# Patient Record
Sex: Male | Born: 1995 | Race: White | Hispanic: No | Marital: Single | Smoking: Never smoker
Health system: Southern US, Community
[De-identification: ages and names within clinical notes are randomized; demographics above are authoritative.]

## PROBLEM LIST (undated history)

## (undated) DIAGNOSIS — R42 Dizziness and giddiness: Secondary | ICD-10-CM

## (undated) DIAGNOSIS — R002 Palpitations: Secondary | ICD-10-CM

## (undated) HISTORY — DX: Dizziness and giddiness: R42

## (undated) HISTORY — DX: Palpitations: R00.2

## (undated) HISTORY — PX: HERNIA REPAIR: SHX51

---
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
## 2017-07-08 ENCOUNTER — Emergency Department: Payer: 59

## 2017-07-08 ENCOUNTER — Observation Stay
Admission: EM | Admit: 2017-07-08 | Discharge: 2017-07-09 | Disposition: A | Discharge status: Home / Self Care | Payer: 59 | Attending: Internal Medicine | Admitting: Internal Medicine

## 2017-07-08 ENCOUNTER — Other Ambulatory Visit: Payer: Self-pay

## 2017-07-08 ENCOUNTER — Encounter: Payer: Self-pay | Admitting: *Deleted

## 2017-07-08 DIAGNOSIS — E876 Hypokalemia: Secondary | ICD-10-CM | POA: Diagnosis not present

## 2017-07-08 DIAGNOSIS — Z9889 Other specified postprocedural states: Secondary | ICD-10-CM | POA: Diagnosis not present

## 2017-07-08 DIAGNOSIS — R002 Palpitations: Principal | ICD-10-CM | POA: Insufficient documentation

## 2017-07-08 DIAGNOSIS — R000 Tachycardia, unspecified: Secondary | ICD-10-CM | POA: Diagnosis not present

## 2017-07-08 DIAGNOSIS — Z8249 Family history of ischemic heart disease and other diseases of the circulatory system: Secondary | ICD-10-CM | POA: Diagnosis not present

## 2017-07-08 LAB — TROPONIN I

## 2017-07-08 LAB — MAGNESIUM: Magnesium: 2.1 mg/dL (ref 1.7–2.4)

## 2017-07-08 LAB — BASIC METABOLIC PANEL
ANION GAP: 11 (ref 5–15)
BUN: 19 mg/dL (ref 6–20)
CALCIUM: 9.4 mg/dL (ref 8.9–10.3)
CHLORIDE: 103 mmol/L (ref 98–111)
CO2: 23 mmol/L (ref 22–32)
CREATININE: 1.05 mg/dL (ref 0.61–1.24)
GFR calc Af Amer: >60 mL/min (ref >60)
GFR calc non Af Amer: >60 mL/min (ref >60)
Glucose, Bld: 123 mg/dL — ABNORMAL HIGH (ref 70–99)
Potassium: 3.1 mmol/L — ABNORMAL LOW (ref 3.5–5.1)
Sodium: 137 mmol/L (ref 135–145)

## 2017-07-08 LAB — CBC
HCT: 46.6 % (ref 40.0–52.0)
HEMOGLOBIN: 16.1 g/dL (ref 13.0–18.0)
MCH: 30 pg (ref 26.0–34.0)
MCHC: 34.6 g/dL (ref 32.0–36.0)
MCV: 86.7 fL (ref 80.0–100.0)
Platelets: 263 10*3/uL (ref 150–440)
RBC: 5.37 MIL/uL (ref 4.40–5.90)
RDW: 13 % (ref 11.5–14.5)
WBC: 6.7 10*3/uL (ref 3.8–10.6)

## 2017-07-08 MED ORDER — LORAZEPAM 2 MG/ML IJ SOLN
1.0000 mg | Freq: Once | INTRAMUSCULAR | Status: AC
  Administered 2017-07-08: 1 mg via INTRAVENOUS
  Filled 2017-07-08: qty 1

## 2017-07-08 MED ORDER — MAGNESIUM SULFATE IN D5W 1-5 GM/100ML-% IV SOLN
1.0000 g | Freq: Once | INTRAVENOUS | Status: AC
  Administered 2017-07-08: 1 g via INTRAVENOUS
  Filled 2017-07-08: qty 100

## 2017-07-08 MED ORDER — POTASSIUM CHLORIDE CRYS ER 20 MEQ PO TBCR
40.0000 meq | EXTENDED_RELEASE_TABLET | Freq: Once | ORAL | Status: AC
  Administered 2017-07-08: 40 meq via ORAL
  Filled 2017-07-08: qty 2

## 2017-07-08 MED ORDER — POTASSIUM CHLORIDE 10 MEQ/100ML IV SOLN
10.0000 meq | Freq: Once | INTRAVENOUS | Status: AC
  Administered 2017-07-08: 10 meq via INTRAVENOUS
  Filled 2017-07-08: qty 100

## 2017-07-08 MED ORDER — SODIUM CHLORIDE 0.9 % IV BOLUS
500.0000 mL | Freq: Once | INTRAVENOUS | Status: AC
  Administered 2017-07-08: 500 mL via INTRAVENOUS

## 2017-07-08 NOTE — ED Triage Notes (Signed)
Pt at baseball game, announcing, in a hot environment. Pt states he started having palpitations approximately 45 minutes ago. Pt denies chest pain and syncope. Pt denies n/v. Pt denies shortness of breath. Pt has had a cough x 5 months, hx of pneumonia. Pt states productive cough. Pt seen on scene by paramedic, report to EMS that pt had heart rate greater than 200 bpm. Pt A&O x 4 and ambulatory at this time.

## 2017-07-08 NOTE — ED Notes (Signed)
ED Provider at bedside. 

## 2017-07-08 NOTE — H&P (Signed)
[facility] - [place] at [facility]   PATIENT NAME: Edward Bryan    MR#:  [id number]  DATE OF BIRTH:  03/02/1995  DATE OF ADMISSION:  07/08/2017  PRIMARY CARE PHYSICIAN: Patient, No Pcp Per   REQUESTING/REFERRING PHYSICIAN:   CHIEF COMPLAINT:   Chief Complaint  Patient presents with  . Palpitations    HISTORY OF PRESENT ILLNESS: Edward Bryan  is a 22 y.o. male without any medical history. Patient is currently visiting from [place].  He was announcing in a baseball game, when he suddenly felt his heart was racing and he also felt tingling in his fingers and cramping in his hands.  No chest pain, no syncope, no fever/chills, no N/V/D.  Patient denies using any caffeine, tobacco alcohol or, recreational drugs, but he admits being under a lot of stress lately.  His mother has hypertrophic cardiomyopathy with previous cardiac arrest. His heart rate was confirmed to be high by paramedics, around 200.  He was brought to emergency room for further evaluation and treatment. Blood test done emergency room are remarkable for low potassium at 3.1.  Troponin level is lower than 0.03 Chest x-ray is unremarkable.  On reviewing the telemetry strip, an episode of sinus tachycardia with heart rate at 160 was noted, but otherwise, patient has been in normal sinus rhythm with heart rate of 90-100. Patient is admitted as observation to be further monitored and evaluated with an echocardiogram.  PAST MEDICAL HISTORY:  History reviewed. No pertinent past medical history.  PAST SURGICAL HISTORY:  Past Surgical History:  Procedure Laterality Date  . HERNIA REPAIR      SOCIAL HISTORY:  Social History   Tobacco Use  . Smoking status: Never Smoker  . Smokeless tobacco: Never Used  Substance Use Topics  . Alcohol use: Yes    Alcohol/week: 1.2 oz    Types: 2 Cans of beer per week    Comment: last ETOH 07/07/17    FAMILY HISTORY: Positive history of hypertrophic  cardiomyopathy with previous cardiac arrest in mother.  DRUG ALLERGIES: No Known Allergies  REVIEW OF SYSTEMS:   CONSTITUTIONAL: No fever, fatigue or weakness.  EYES: No blurred or double vision.  EARS, NOSE, AND THROAT: No tinnitus or ear pain.  RESPIRATORY: No cough, shortness of breath, wheezing or hemoptysis.  CARDIOVASCULAR: Positive for lightheadedness and tachycardia.  No chest pain, orthopnea, edema.  GASTROINTESTINAL: No nausea, vomiting, diarrhea or abdominal pain.  GENITOURINARY: No dysuria, hematuria.  ENDOCRINE: No polyuria, nocturia,  HEMATOLOGY: No anemia, easy bruising or bleeding SKIN: No rash or lesion. MUSCULOSKELETAL: No joint pain or arthritis.   NEUROLOGIC: Patient reports a tingling sensation and cramping in the muscles of both hands, resolved now.  Negative for focal weakness. PSYCHIATRY: No anxiety or depression.   MEDICATIONS AT HOME:  Prior to Admission medications   Not on File      PHYSICAL EXAMINATION:   VITAL SIGNS: Blood pressure 125/90, pulse (!) 158, temperature 98 F (36.7 C), temperature source Oral, resp. rate (!) 28, height 5\' 10"  (1.778 m), weight 72.6 kg (160 lb), SpO2 97 %.  GENERAL:  22 y.o.-year-old patient lying in the bed with no acute distress.  EYES: Pupils equal, round, reactive to light and accommodation. No scleral icterus. Extraocular muscles intact.  HEENT: Head atraumatic, normocephalic. Oropharynx and nasopharynx clear.  NECK:  Supple, no jugular venous distention. No thyroid enlargement, no tenderness.  LUNGS: Normal breath sounds bilaterally, no wheezing, rales,rhonchi or crepitation. No use of accessory  muscles of respiration.  CARDIOVASCULAR: S1, S2 normal. No murmurs, rubs, or gallops.  ABDOMEN: Soft, nontender, nondistended. Bowel sounds present. No organomegaly or mass.  EXTREMITIES: No pedal edema, cyanosis, or clubbing.  NEUROLOGIC: Cranial nerves II through XII are intact. Muscle strength 5/5 in all extremities.  Sensation intact. Gait not checked.  PSYCHIATRIC: The patient is alert and oriented x 3.  SKIN: No obvious rash, lesion, or ulcer.   LABORATORY PANEL:   CBC Recent Labs  Lab 07/08/17 2124  WBC 6.7  HGB 16.1  HCT 46.6  PLT 263  MCV 86.7  MCH 30.0  MCHC 34.6  RDW 13.0   ------------------------------------------------------------------------------------------------------------------  Chemistries  Recent Labs  Lab 07/08/17 2124  NA 137  K 3.1*  CL 103  CO2 23  GLUCOSE 123*  BUN 19  CREATININE 1.05  CALCIUM 9.4  MG 2.1   ------------------------------------------------------------------------------------------------------------------ estimated creatinine clearance is 116.2 mL/min (by C-G formula based on SCr of 1.05 mg/dL). ------------------------------------------------------------------------------------------------------------------ No results for input(s): TSH, T4TOTAL, T3FREE, THYROIDAB in the last 72 hours.  Invalid input(s): FREET3   Coagulation profile No results for input(s): INR, PROTIME in the last 168 hours. ------------------------------------------------------------------------------------------------------------------- No results for input(s): DDIMER in the last 72 hours. -------------------------------------------------------------------------------------------------------------------  Cardiac Enzymes Recent Labs  Lab 07/08/17 2124  TROPONINI <0.03   ------------------------------------------------------------------------------------------------------------------ Invalid input(s): POCBNP  ---------------------------------------------------------------------------------------------------------------  Urinalysis No results found for: COLORURINE, APPEARANCEUR, LABSPEC, PHURINE, GLUCOSEU, HGBUR, BILIRUBINUR, KETONESUR, PROTEINUR, UROBILINOGEN, NITRITE, LEUKOCYTESUR   RADIOLOGY: Dg Chest 2 View  Result Date: 07/08/2017 CLINICAL DATA:   Palpitations, chest pain, and syncope starting 45 minutes ago. Cough for 5 months. Elevated heart rate. EXAM: CHEST - 2 VIEW COMPARISON:  None. FINDINGS: The heart size and mediastinal contours are within normal limits. Both lungs are clear. The visualized skeletal structures are unremarkable. IMPRESSION: No active cardiopulmonary disease. Electronically Signed   By: Burman NievesWilliam  Stevens M.D.   On: 07/08/2017 21:44    EKG: Orders placed or performed during the hospital encounter of 07/08/17  . EKG 12-Lead  . EKG 12-Lead  . ED EKG within 10 minutes  . ED EKG within 10 minutes    IMPRESSION AND PLAN:  1.  Heart palpitations 2.  Sinus tachycardia 3.  Hypokalemia  Plan:  - Continue to monitor on telemetry and follow troponin levels - Check 2D echo - Start IV fluids and replace potassium and magnesium - Consult cardiology for further evaluation and treatment.  All the records are reviewed and case discussed with ED provider. Management plans discussed with the patient, family and they are in agreement.  CODE STATUS: FULL   TOTAL TIME TAKING CARE OF THIS PATIENT: 40 minutes.    Cammy CopaAngela Aayush Gelpi M.D on 07/08/2017 at 11:58 PM  Between 7am to 6pm - Pager - [phone]  After 6pm go to [email or web address] - password [email or web address]  [facility]  Office  [phone]  CC: Primary care physician; Patient, No Pcp Per

## 2017-07-09 ENCOUNTER — Observation Stay (HOSPITAL_BASED_OUTPATIENT_CLINIC_OR_DEPARTMENT_OTHER)
Admit: 2017-07-09 | Discharge: 2017-07-09 | Disposition: A | Discharge status: Home / Self Care | Payer: 59 | Attending: Internal Medicine | Admitting: Internal Medicine

## 2017-07-09 DIAGNOSIS — R9431 Abnormal electrocardiogram [ECG] [EKG]: Secondary | ICD-10-CM

## 2017-07-09 LAB — CBC
HEMATOCRIT: 44.8 % (ref 40.0–52.0)
Hemoglobin: 15.3 g/dL (ref 13.0–18.0)
MCH: 30.2 pg (ref 26.0–34.0)
MCHC: 34.1 g/dL (ref 32.0–36.0)
MCV: 88.7 fL (ref 80.0–100.0)
PLATELETS: 258 10*3/uL (ref 150–440)
RBC: 5.05 MIL/uL (ref 4.40–5.90)
RDW: 12.9 % (ref 11.5–14.5)
WBC: 8.2 10*3/uL (ref 3.8–10.6)

## 2017-07-09 LAB — BASIC METABOLIC PANEL
Anion gap: 6 (ref 5–15)
BUN: 14 mg/dL (ref 6–20)
CHLORIDE: 111 mmol/L (ref 98–111)
CO2: 25 mmol/L (ref 22–32)
CREATININE: 1.03 mg/dL (ref 0.61–1.24)
Calcium: 9.1 mg/dL (ref 8.9–10.3)
GFR calc Af Amer: >60 mL/min (ref >60)
GFR calc non Af Amer: >60 mL/min (ref >60)
Glucose, Bld: 100 mg/dL — ABNORMAL HIGH (ref 70–99)
POTASSIUM: 4.3 mmol/L (ref 3.5–5.1)
Sodium: 142 mmol/L (ref 135–145)

## 2017-07-09 LAB — ECHOCARDIOGRAM COMPLETE
Height: 70 in
Weight: 2560 oz

## 2017-07-09 LAB — GLUCOSE, CAPILLARY: GLUCOSE-CAPILLARY: 80 mg/dL (ref 70–99)

## 2017-07-09 MED ORDER — METOPROLOL TARTRATE 25 MG PO TABS: 25.0000 mg | ORAL_TABLET | Freq: Two times a day (BID) | ORAL | 0 refills | Status: AC | PRN

## 2017-07-09 MED ORDER — ONDANSETRON HCL 4 MG PO TABS: 4.0000 mg | ORAL_TABLET | Freq: Four times a day (QID) | ORAL | Status: DC | PRN

## 2017-07-09 MED ORDER — ACETAMINOPHEN 650 MG RE SUPP: 650.0000 mg | Freq: Four times a day (QID) | RECTAL | Status: DC | PRN

## 2017-07-09 MED ORDER — ONDANSETRON HCL 4 MG/2ML IJ SOLN: 4.0000 mg | Freq: Four times a day (QID) | INTRAMUSCULAR | Status: DC | PRN

## 2017-07-09 MED ORDER — POTASSIUM CHLORIDE IN NACL 40-0.9 MEQ/L-% IV SOLN
INTRAVENOUS | Status: DC
  Administered 2017-07-09: 75 mL/h via INTRAVENOUS
  Filled 2017-07-09 (×2): qty 1000

## 2017-07-09 MED ORDER — DOCUSATE SODIUM 100 MG PO CAPS: 100.0000 mg | ORAL_CAPSULE | Freq: Two times a day (BID) | ORAL | Status: DC

## 2017-07-09 MED ORDER — METOPROLOL SUCCINATE ER 50 MG PO TB24: 50.0000 mg | ORAL_TABLET | Freq: Every day | ORAL | Status: DC

## 2017-07-09 MED ORDER — BISACODYL 5 MG PO TBEC: 5.0000 mg | DELAYED_RELEASE_TABLET | Freq: Every day | ORAL | Status: DC | PRN

## 2017-07-09 MED ORDER — METOPROLOL TARTRATE 5 MG/5ML IV SOLN: 2.5000 mg | Freq: Four times a day (QID) | INTRAVENOUS | Status: DC | PRN

## 2017-07-09 MED ORDER — SODIUM CHLORIDE 0.9 % IV SOLN: INTRAVENOUS | Status: DC

## 2017-07-09 MED ORDER — ACETAMINOPHEN 325 MG PO TABS: 650.0000 mg | ORAL_TABLET | Freq: Four times a day (QID) | ORAL | Status: DC | PRN

## 2017-07-09 MED ORDER — HYDROCODONE-ACETAMINOPHEN 5-325 MG PO TABS: 1.0000 | ORAL_TABLET | ORAL | Status: DC | PRN

## 2017-07-09 NOTE — Progress Notes (Signed)
Pharmacy Electrolyte Monitoring Consult:  Pharmacy consulted to assist in monitoring and replacing electrolytes in this 22 y.o. male admitted on 07/08/2017 with Palpitations   Labs:  Sodium (mmol/L)  Date Value  07/09/2017 142   Potassium (mmol/L)  Date Value  07/09/2017 4.3   Magnesium (mg/dL)  Date Value  96/04/540907/04/2017 2.1   Calcium (mg/dL)  Date Value  81/19/147807/05/2017 9.1    Assessment/Plan: Patient was started overnight on fluids of NS + KCl w/ 40 mEq IV @ 75 ml/hr. Additionally, this patient reveived 10mEq IV KCl and 40 mEq po KCl. A follow-up potassium level 4.3 mml/L. Potassium has been replaced and level corrected. IN NS40 will be stopped and pharmacy will sign off.   Edward Bryan, PharmD Clinical Pharmacist 07/09/2017

## 2017-07-09 NOTE — ED Notes (Signed)
Admitting MD at bedside.

## 2017-07-09 NOTE — Progress Notes (Signed)
Pt ambulated around the nurses station. Pts HR remained below 100 bpm while ambulating with no complains of dizziness or pain. I will continue to assess.

## 2017-07-09 NOTE — Discharge Summary (Addendum)
[facility] [place] [facility]   PATIENT NAME: Edward MerlKevin Bryan    MR#:  [id number]  DATE OF BIRTH:  09/13/1995  DATE OF ADMISSION:  07/08/2017 ADMITTING PHYSICIAN: Cammy CopaAngela Maier, MD  DATE OF DISCHARGE: 07/09/2017  PRIMARY CARE PHYSICIAN: Patient, No Pcp Per    ADMISSION DIAGNOSIS:  Palpitations [R00.2] Hypokalemia [E87.6] Tachycardia [R00.0] Heart palpitations [R00.2]  DISCHARGE DIAGNOSIS:  Active Problems:   Tachycardia   SECONDARY DIAGNOSIS:  History reviewed. No pertinent past medical history.  HOSPITAL COURSE:    22 year old male with no past medical history who presented with tachycardia.  1.  Sinus tachycardia: Patient had no evidence of atrial fibrillation or SVT.  His heart rates have improved.  Echocardiogram is normal. Patient was eval by cardiology.  Recommendations were to start metoprolol 50 mg daily.  Patient and patient's family was hesitant to do this.  We will do metoprolol 25 mg p.o. twice daily PRN for palpitations and heart rates.  Some of this may be stress-induced which patient will be working on.  Patient will see a cardiologist/EP physician if this reoccurs back in [place] he is from.   2.  Hypokalemia: Sinus tachycardia could be result of low potassium.  This was repleted prior to discharge. DISCHARGE CONDITIONS AND DIET:   Stable for discharge on regular diet  CONSULTS OBTAINED:  Treatment Team:  Laurier NancyKhan, Shaukat A, MD  DRUG ALLERGIES:  No Known Allergies  DISCHARGE MEDICATIONS:   Allergies as of 07/09/2017   No Known Allergies     Medication List    TAKE these medications   metoprolol tartrate 25 MG tablet Commonly known as:  LOPRESSOR Take 1 tablet (25 mg total) by mouth 2 (two) times daily as needed.         Today   CHIEF COMPLAINT:   Doing well this morning.  No palpitations   VITAL SIGNS:  Blood pressure 134/80, pulse 79, temperature (!) 97.5 F (36.4 C), temperature source Oral, resp.  rate 18, height 5\' 10"  (1.778 m), weight 72.6 kg (160 lb), SpO2 100 %.   REVIEW OF SYSTEMS:  Review of Systems  Constitutional: Negative.  Negative for chills, fever and malaise/fatigue.  HENT: Negative.  Negative for ear discharge, ear pain, hearing loss, nosebleeds and sore throat.   Eyes: Negative.  Negative for blurred vision and pain.  Respiratory: Negative.  Negative for cough, hemoptysis, shortness of breath and wheezing.   Cardiovascular: Negative.  Negative for chest pain, palpitations and leg swelling.  Gastrointestinal: Negative.  Negative for abdominal pain, blood in stool, diarrhea, nausea and vomiting.  Genitourinary: Negative.  Negative for dysuria.  Musculoskeletal: Negative.  Negative for back pain.  Skin: Negative.   Neurological: Negative for dizziness, tremors, speech change, focal weakness, seizures and headaches.  Endo/Heme/Allergies: Negative.  Does not bruise/bleed easily.  Psychiatric/Behavioral: Negative.  Negative for depression, hallucinations and suicidal ideas.     PHYSICAL EXAMINATION:  GENERAL:  22 y.o.-year-old patient lying in the bed with no acute distress.  NECK:  Supple, no jugular venous distention. No thyroid enlargement, no tenderness.  LUNGS: Normal breath sounds bilaterally, no wheezing, rales,rhonchi  No use of accessory muscles of respiration.  CARDIOVASCULAR: S1, S2 normal. No murmurs, rubs, or gallops.  ABDOMEN: Soft, non-tender, non-distended. Bowel sounds present. No organomegaly or mass.  EXTREMITIES: No pedal edema, cyanosis, or clubbing.  PSYCHIATRIC: The patient is alert and oriented x 3.  SKIN: No obvious rash, lesion, or ulcer.   DATA REVIEW:  CBC Recent Labs  Lab 07/09/17 0605  WBC 8.2  HGB 15.3  HCT 44.8  PLT 258    Chemistries  Recent Labs  Lab 07/08/17 2124 07/09/17 0605  NA 137 142  K 3.1* 4.3  CL 103 111  CO2 23 25  GLUCOSE 123* 100*  BUN 19 14  CREATININE 1.05 1.03  CALCIUM 9.4 9.1  MG 2.1  --      Cardiac Enzymes Recent Labs  Lab 07/08/17 2124  TROPONINI <0.03    Microbiology Results  @[email or web address] @  RADIOLOGY:  Dg Chest 2 View  Result Date: 07/08/2017 CLINICAL DATA:  Palpitations, chest pain, and syncope starting 45 minutes ago. Cough for 5 months. Elevated heart rate. EXAM: CHEST - 2 VIEW COMPARISON:  None. FINDINGS: The heart size and mediastinal contours are within normal limits. Both lungs are clear. The visualized skeletal structures are unremarkable. IMPRESSION: No active cardiopulmonary disease. Electronically Signed   By: Burman Nieves M.D.   On: 07/08/2017 21:44      Allergies as of 07/09/2017   No Known Allergies     Medication List    TAKE these medications   metoprolol tartrate 25 MG tablet Commonly known as:  LOPRESSOR Take 1 tablet (25 mg total) by mouth 2 (two) times daily as needed.         Management plans discussed with the patient and he is in agreement. Stable for discharge home  Patient [place] follow up with cardiology in [place]  CODE STATUS:     Code Status Orders  (From admission, onward)        Start     Ordered   07/09/17 0114  Full code  Continuous     07/09/17 0113    Code Status History    This patient has a current code status but no historical code status.      TOTAL TIME TAKING CARE OF THIS PATIENT: 39 minutes.    Note: This dictation was prepared with Dragon dictation along with smaller phrase technology. Any transcriptional errors that result from this process are unintentional.  Milbert Bixler M.D on 07/09/2017 at 11:21 AM  Between 7am to 6pm - Pager - [phone] After 6pm go to [email or web address] - password [email or web address]  [facility]  Office  [phone]  CC: Primary care physician; Patient, No Pcp Per

## 2017-07-09 NOTE — Progress Notes (Signed)
Pharmacy Electrolyte Monitoring Consult:  Pharmacy consulted to assist in monitoring and replacing electrolytes in this 22 y.o. male admitted on 07/08/2017 with Palpitations   Labs:  Sodium (mmol/L)  Date Value  07/08/2017 137   Potassium (mmol/L)  Date Value  07/08/2017 3.1 (L)   Magnesium (mg/dL)  Date Value  78/29/562107/04/2017 2.1   Calcium (mg/dL)  Date Value  30/86/578407/04/2017 9.4    Assessment/Plan: Will start patient on fluids of NS + KCl w/ 40 mEq IV @ 75 ml/hr and will check BMP w/ am labs.  Thomasene Rippleavid Kanon Colunga, PharmD, BCPS Clinical Pharmacist 07/09/2017

## 2017-07-09 NOTE — Progress Notes (Signed)
*  PRELIMINARY RESULTS* Echocardiogram 2D Echocardiogram has been performed.  Edward Bryan, Edward Bryan 07/09/2017, 8:55 AM

## 2017-07-09 NOTE — Consult Note (Signed)
Edward Bryan is a 22 y.o. male  [id number]  Primary Cardiologist: Adrian Blackwater Reason for Consultation: Palpitation and tachycardia with history of hypertrophic cardiomyopathy apical variety in the family.  HPI: This is a 22 year old white male with history ofsignificant medical problem presented to the hospital after being in again yesterday and started feeling dizzy and lightheadedness with palpitation and felt like he was going to pass out. Apparently there is no strip to suggest that he had V. tach or atrial fibrillation or SVT.   Review of Systems: No chest pain   History reviewed. No pertinent past medical history.  No medications prior to admission.     . docusate sodium  100 mg Oral BID    Infusions: . sodium chloride    . 0.9 % NaCl with KCl 40 mEq / L 75 mL/hr (07/09/17 0135)    No Known Allergies  Social History   Socioeconomic History  . Marital status: Single    Spouse name: Not on file  . Number of children: Not on file  . Years of education: Not on file  . Highest education level: Not on file  Occupational History  . Not on file  Social Needs  . Financial resource strain: Not on file  . Food insecurity:    Worry: Not on file    Inability: Not on file  . Transportation needs:    Medical: Not on file    Non-medical: Not on file  Tobacco Use  . Smoking status: Never Smoker  . Smokeless tobacco: Never Used  Substance and Sexual Activity  . Alcohol use: Yes    Alcohol/week: 1.2 oz    Types: 2 Cans of beer per week    Comment: last ETOH 07/07/17  . Drug use: Not Currently  . Sexual activity: Never  Lifestyle  . Physical activity:    Days per week: Not on file    Minutes per session: Not on file  . Stress: Not on file  Relationships  . Social connections:    Talks on phone: Not on file    Gets together: Not on file    Attends religious service: Not on file    Active member of club or organization: Not on file    Attends meetings of  clubs or organizations: Not on file    Relationship status: Not on file  . Intimate partner violence:    Fear of current or ex partner: Not on file    Emotionally abused: Not on file    Physically abused: Not on file    Forced sexual activity: Not on file  Other Topics Concern  . Not on file  Social History Narrative  . Not on file    History reviewed. No pertinent family history.  PHYSICAL EXAM: Vitals:   07/09/17 0109 07/09/17 0750  BP: 127/81 134/80  Pulse: 91 79  Resp: 18 18  Temp: 97.7 F (36.5 C) (!) 97.5 F (36.4 C)  SpO2: 99% 100%     Intake/Output Summary (Last 24 hours) at 07/09/2017 0906 Last data filed at 07/09/2017 0600 Gross per 24 hour  Intake 300 ml  Output -  Net 300 ml    General:  Well appearing. No respiratory difficulty HEENT: normal Neck: supple. no JVD. Carotids 2+ bilat; no bruits. No lymphadenopathy or thryomegaly appreciated. Cor: PMI nondisplaced. Regular rate & rhythm. No rubs, gallops or murmurs. Lungs: clear Abdomen: soft, nontender, nondistended. No hepatosplenomegaly. No bruits or masses. Good bowel sounds. Extremities: no  cyanosis, clubbing, rash, edema Neuro: alert & oriented x 3, cranial nerves grossly intact. moves all 4 extremities w/o difficulty. Affect pleasant.  ECG: Normal sinus rhythm with LVH and nonspecific ST-T changes in the inferolateral leads  Results for orders placed or performed during the hospital encounter of 07/08/17 (from the past 24 hour(s))  Basic metabolic panel     Status: Abnormal   Collection Time: 07/08/17  9:24 PM  Result Value Ref Range   Sodium 137 135 - 145 mmol/L   Potassium 3.1 (L) 3.5 - 5.1 mmol/L   Chloride 103 98 - 111 mmol/L   CO2 23 22 - 32 mmol/L   Glucose, Bld 123 (H) 70 - 99 mg/dL   BUN 19 6 - 20 mg/dL   Creatinine, Ser 7.821.05 0.61 - 1.24 mg/dL   Calcium 9.4 8.9 - 95.610.3 mg/dL   GFR calc non Af Amer >60 >60 mL/min   GFR calc Af Amer >60 >60 mL/min   Anion gap 11 5 - 15  CBC     Status:  None   Collection Time: 07/08/17  9:24 PM  Result Value Ref Range   WBC 6.7 3.8 - 10.6 K/uL   RBC 5.37 4.40 - 5.90 MIL/uL   Hemoglobin 16.1 13.0 - 18.0 g/dL   HCT 21.346.6 08.640.0 - 57.852.0 %   MCV 86.7 80.0 - 100.0 fL   MCH 30.0 26.0 - 34.0 pg   MCHC 34.6 32.0 - 36.0 g/dL   RDW 46.913.0 62.911.5 - 52.814.5 %   Platelets 263 150 - 440 K/uL  Troponin I     Status: None   Collection Time: 07/08/17  9:24 PM  Result Value Ref Range   Troponin I <0.03 <0.03 ng/mL  Magnesium     Status: None   Collection Time: 07/08/17  9:24 PM  Result Value Ref Range   Magnesium 2.1 1.7 - 2.4 mg/dL  Basic metabolic panel     Status: Abnormal   Collection Time: 07/09/17  6:05 AM  Result Value Ref Range   Sodium 142 135 - 145 mmol/L   Potassium 4.3 3.5 - 5.1 mmol/L   Chloride 111 98 - 111 mmol/L   CO2 25 22 - 32 mmol/L   Glucose, Bld 100 (H) 70 - 99 mg/dL   BUN 14 6 - 20 mg/dL   Creatinine, Ser 4.131.03 0.61 - 1.24 mg/dL   Calcium 9.1 8.9 - 24.410.3 mg/dL   GFR calc non Af Amer >60 >60 mL/min   GFR calc Af Amer >60 >60 mL/min   Anion gap 6 5 - 15  CBC     Status: None   Collection Time: 07/09/17  6:05 AM  Result Value Ref Range   WBC 8.2 3.8 - 10.6 K/uL   RBC 5.05 4.40 - 5.90 MIL/uL   Hemoglobin 15.3 13.0 - 18.0 g/dL   HCT 01.044.8 27.240.0 - 53.652.0 %   MCV 88.7 80.0 - 100.0 fL   MCH 30.2 26.0 - 34.0 pg   MCHC 34.1 32.0 - 36.0 g/dL   RDW 64.412.9 03.411.5 - 74.214.5 %   Platelets 258 150 - 440 K/uL  Glucose, capillary     Status: None   Collection Time: 07/09/17  7:52 AM  Result Value Ref Range   Glucose-Capillary 80 70 - 99 mg/dL   Dg Chest 2 View  Result Date: 07/08/2017 CLINICAL DATA:  Palpitations, chest pain, and syncope starting 45 minutes ago. Cough for 5 months. Elevated heart rate. EXAM: CHEST - 2 VIEW COMPARISON:  None. FINDINGS: The heart size and mediastinal contours are within normal limits. Both lungs are clear. The visualized skeletal structures are unremarkable. IMPRESSION: No active cardiopulmonary disease. Electronically  Signed   By: Burman Nieves M.D.   On: 07/08/2017 21:44     ASSESSMENT AND PLAN: Presyncope with the supposedly sinus tachycardia on monitor as well as on EKG no evidence of SVT or V. tach so far on monitor as well as on EKG. Echocardiogram shows normal left reticular systolic function and no evidence of LVH or hypertrophic cardiomyopathy and advise patient to be placed on metoprolol 50 mg once a day. Patient can be discharged with follow-up a cardiologist in [place] with her family lives.  Charleton Deyoung A

## 2017-07-09 NOTE — ED Provider Notes (Signed)
[facility] Emergency Department Provider Note   ____________________________________________   First MD Initiated Contact with Patient 07/08/17 2355     (approximate)  I have reviewed the triage vital signs and the nursing notes.   HISTORY  Chief Complaint Palpitations    HPI Edward Bryan is a 22 y.o. male for evaluation of racing heart.  Patient is from [place], he was announcing in a baseball game when he suddenly felt as though his heart was racing very quickly today.  He does report it was a warm outside, and he reports he did not stay well-hydrated yesterday as they are out and about during the day and he felt thirsty.  Did not pass out.  Did not experience chest pain.  Reports that he felt a racing in his heart that he felt tingling in his fingers and cramping in his hands.  No chest pain or trouble breathing.  No fevers or chills.  No headache.  No abdominal pain.  Does relate a history that his mother has a history of hypertrophic heart disease with a previous cardiac arrest.  He has been followed by cardiology regularly, but has not shown any evidence of this, and his mom reports that they have done genetic testing but have not shown that he has hypertrophic heart disease though they continue to be followed regularly with cardiology follow-up in [place].  History reviewed. No pertinent past medical history.  Patient Active Problem List   Diagnosis Date Noted  . Tachycardia 07/08/2017    Past Surgical History:  Procedure Laterality Date  . HERNIA REPAIR      Prior to Admission medications   Not on File    Allergies Patient has no known allergies.  History reviewed. No pertinent family history.  Social History Social History   Tobacco Use  . Smoking status: Never Smoker  . Smokeless tobacco: Never Used  Substance Use Topics  . Alcohol use: Yes    Alcohol/week: 1.2 oz    Types: 2 Cans of beer per week    Comment: last ETOH  07/07/17  . Drug use: Not Currently    Review of Systems Constitutional: No fever/chills but felt very lightheaded as though he was going to pass out, now improved Eyes: No visual changes. ENT: No sore throat. Cardiovascular: Denies chest pain.  Racing heart and a heart rate almost 200 when he reports EMS arrived. Respiratory: Denies shortness of breath.  Had a pneumonia several months ago. Gastrointestinal: No abdominal pain.  Felt nauseated. Genitourinary: Negative for dysuria. Musculoskeletal: Negative for back pain. Skin: Negative for rash. Neurological: Negative for headaches or weakness.  Reports a tingling sensation and a cramping in the muscles of both hands is getting better now.    ____________________________________________   PHYSICAL EXAM:  VITAL SIGNS: ED Triage Vitals [07/08/17 2121]  Enc Vitals Group     BP (!) 143/92     Pulse Rate 86     Resp 13     Temp 98 F (36.7 C)     Temp Source Oral     SpO2 100 %     Weight 160 lb (72.6 kg)     Height 5\' 10"  (1.778 m)     Head Circumference      Peak Flow      Pain Score 0     Pain Loc      Pain Edu?      Excl. in GC?     Constitutional: Alert  and oriented. Well appearing and in no acute distress. Eyes: Conjunctivae are normal. Head: Atraumatic. Nose: No congestion/rhinnorhea. Mouth/Throat: Mucous membranes are  dry. Neck: No stridor.   Cardiovascular: Normal rate, regular rhythm. Grossly normal heart sounds.  Good peripheral circulation. Respiratory: Normal respiratory effort.  No retractions. Lungs CTAB. Gastrointestinal: Soft and nontender. No distention. Musculoskeletal: No lower extremity tenderness nor edema. Neurologic:  Normal speech and language. No gross focal neurologic deficits are appreciated.  Skin:  Skin is warm, dry and intact. No rash noted. Psychiatric: Mood and affect are normal. Speech and behavior are normal.  ____________________________________________   LABS (all labs ordered  are listed, but only abnormal results are displayed)  Labs Reviewed  BASIC METABOLIC PANEL - Abnormal; Notable for the following components:      Result Value   Potassium 3.1 (*)    Glucose, Bld 123 (*)    All other components within normal limits  CBC  TROPONIN I  MAGNESIUM  BASIC METABOLIC PANEL   ____________________________________________  EKG Initial EKG reviewed at 2120 Heart rate 80 QRS 90 QTc 400 Normal sinus rhythm, no evidence of acute ischemia though a slight T wave inversion is noted in inferior leads, also somewhat high voltage is noted across the precordium on the left side.  No prolongation of QT.  No WPW.  No Brugada.  EKG is however questionable for possible left ventricular hypertrophy, but unusually aVL is downgoing.  No ST elevation.    At 2308 a repeat EKG is reviewed Heart rate now 160 Cures 89 QTC 490 Sinus tachycardia, no evidence of ischemia, there is ongoing mild T wave abnormality now seen inferolateral lateral regions.  No ST elevation.  Possible rate related changes.  ____________________________________________  RADIOLOGY  Chest x-ray reviewed negative for acute  ____________________________________________   PROCEDURES  Procedure(s) performed: None  Procedures  Critical Care performed: No  ____________________________________________   INITIAL IMPRESSION / ASSESSMENT AND PLAN / ED COURSE  Pertinent labs & imaging results that were available during my care of the patient were reviewed by me and considered in my medical decision making (see chart for details).  Vitals:   07/08/17 2345 07/09/17 0015  BP: 140/83 125/78  Pulse: 98 92  Resp: 19 17  Temp:    SpO2: 98% 95%    Patient presents for palpitations, EKG somewhat abnormal and given the history of cardiac arrest of his mother with a history of hypertrophic heart disease and concerned about the patient and that he could have this, though I am not certain.  Discussed case  with Dr. Park BreedKahn of cardiology, he recommends admission that he will see the patient in consultation tomorrow and asked that a echocardiogram be performed.  In the ER he had a recurrent episode of a apparent sinus tachycardia with palpitations, this was improved after IV hydration, magnesium potassium repletion and 1 dose of Ativan.  He reports after Ativan he felt much better, and is resting comfortably with return to hemodynamic stability.  Discussed with his family, we will admit the patient for ongoing evaluation including anticipation cardiology consult.  Stable.  ----------------------------------------- 12:31 AM on 07/09/2017 -----------------------------------------  Ongoing care assigned Dr. Juliette AlcideMelinda, patient admitted to hospitalist service for consultation with cardiology.      ____________________________________________   FINAL CLINICAL IMPRESSION(S) / ED DIAGNOSES  Final diagnoses:  Palpitations  Hypokalemia  Heart palpitations  Tachycardia      NEW MEDICATIONS STARTED DURING THIS VISIT:  New Prescriptions   No medications on file  Note:  This document was prepared using Dragon voice recognition software and may include unintentional dictation errors.     Sharyn Creamer, MD 07/09/17 [phone]

## 2017-07-10 LAB — HIV ANTIBODY (ROUTINE TESTING W REFLEX): HIV Screen 4th Generation wRfx: NONREACTIVE

## 2017-07-14 ENCOUNTER — Ambulatory Visit (INDEPENDENT_AMBULATORY_CARE_PROVIDER_SITE_OTHER): Payer: No Typology Code available for payment source | Admitting: Pediatric Cardiology

## 2017-07-14 ENCOUNTER — Encounter (INDEPENDENT_AMBULATORY_CARE_PROVIDER_SITE_OTHER): Payer: Self-pay | Admitting: Pediatric Cardiology

## 2017-07-14 DIAGNOSIS — R002 Palpitations: Secondary | ICD-10-CM

## 2017-07-15 NOTE — Progress Notes (Signed)
HPI: I had the pleasure of seeing [name] [name] in the [facility] on 07/14/17. 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 On 07/08/17-07/09/17 while broadcasting he developed acute onset palpitations (quite rapid he counted 200 bpm). The palpitations were the first thing he noticed this was followed by some numbness and tingling in his hands. He was dizzy but did not faint. Apparently the paramedics came shortly thereafter and he was told he was in afib but by the time he arrived in the hsopital he was in sinus rhythm. According to Jivan and his dad the ER also told him that they reviewed the tracings from the ambulance (those were not available to me) and he was not in afib. The episdoes while having an abrupt onset did have a gradual termination. This has never happened before. He did not feel stressed and it seemed to "comeout of the blue". He denied using any illicit meds or drinking energy drinks.     Active Ambulatory Problems     Diagnosis Date Noted   . No Active Ambulatory Problems     Resolved Ambulatory Problems     Diagnosis Date Noted   . No Resolved Ambulatory Problems     Past Medical History:   Diagnosis Date   . Dizziness    . Palpitations      No Known Allergies  No current outpatient prescriptions on file prior to visit.     No current facility-administered medications on file prior to visit.      I reviewed the outside records from [facility]. Electrolytes were normal. Reportedly he had a normal echocardiogram. Troponin normal. His baseline EKG other than showing some nonspecific ST segment changes was normal with no WPW. He was started on Metoprolol and took two doses but did not feel well on the medication and since stopped it.      FAMILY HISTORY  There is no family history of congenital heart disease, sudden cardiac death or an arrhythmia. Both mom and dad are  alive and well. There is no history of long QT syndrome or hypertrophic cardiomyopathy. There is no history of unexplained seizures, drowning's, or car accidents.     SOCIAL HISTORY: Visual merchandiser journalism major college.     REVIEW OF SYSTEMS  A complete 12 point ROS was performed and all were negative. Positive findings are in BOLD    General: weight loss or weight gain, change in appetite, fever, chills, sweats, marked fatigue, difficulty sleeping, bowel/bladder changes  HEENT: difficulty swallowing, hoarseness, nasal congestion, allergies, hearing/vision loss, glasses, contacts  Cardiovascular: chest pain, murmur, palpitations, dizziness  Allergy/Immunology: Hay fever, drug allergies  Respiratory: asthma, morning cough, wheezing, SOB, fast breathing, cough with exercise, productive cough  Gastrointestinal: abdominal distention, abdominal pain, diarrhea, constipation, reflux, vomiting, nausea, blood in stool  Urinary: burning with urination, urinary incontinence, frequency or urgency  Musculoskeletal:  back pain, muscle aches, joint pain  Dermatology: Birthmarks, cyanosis, rash, frequent itching, sweating  Neurological: weakness, headaches, seizures, blackouts/ fainting, tremor, migraines  Endocrinology: excessive weight gain, slow growth, weight loss, excessive thirst, too hot/cold  Hematology: bleeding problems, easy bruising, swollen glands  Psychiatric: depression, stress, anxiety    PHYSICAL EXAMINATION  Vitals:    07/14/17 1013   BP: 122/77   Pulse: 62     Well appearing, well developed, no dysmorphic features  HEENT: sclera anicteric, MMM, neck  supple, PERLLA, EOMI  Lungs: CTA, no rales or wheezes  Cardiac: RRR normal S1 & S2, no thrills, rubs, clicks, or gallop  Abdomen: soft NT no HSM  Extremities: excellent femoral pulses, no radial-femoral delay  Excellent capillary refill,   Neuro: awake, alert, no obvious deficits,   Musculoskeletal: normal tone and gait and movement    ELECTROCARDIOGRAM: Normal  sinus rhythm at 62 bpm, early repolarization, normal q wave in V6    IMPRESSION AND PLAN: It is my impression that Abanoub is a very pleasant 22 year-old young man with a structurally normal heart (per echo outside) who had the abrupt onset of palpitations. The initial history is highly suggestive of an episode of SVT although how it ended seemed less c/w SVT. At this point I would like to hold off on BB and an EPS. I would rather try with an [employer] monitor to see if we catch another episode. While anxiety and stress can cause palpitations he did not seem to be stressed at the time. 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 If we do document SVT I discussed with them proceeding with an ablation if we can document SVT. I have not restricted his activity level.     If he has any chest pain, palpitations, pre-syncope, or syncope I would be more than happy to see him prior to the next scheduled EP office visit.   I thank you very much for allowing me to participate in the care of [facility]. If you have any questions please do not hesitate to reach out to me.    Warmest personal regards,    Serita Sheller MD     Serita Sheller MD [facility]  Co-Director of the Heart Center  Director, Pediatric Arrhythmia Services  [facility]  [phone]  [email or web address]

## 2017-08-04 ENCOUNTER — Ambulatory Visit (INDEPENDENT_AMBULATORY_CARE_PROVIDER_SITE_OTHER): Payer: Self-pay | Admitting: Cardiology

## 2017-08-18 ENCOUNTER — Other Ambulatory Visit: Payer: Self-pay | Admitting: Cardiology

## 2017-08-18 DIAGNOSIS — I472 Ventricular tachycardia, unspecified: Secondary | ICD-10-CM

## 2017-08-18 DIAGNOSIS — I422 Other hypertrophic cardiomyopathy: Secondary | ICD-10-CM

## 2017-08-20 ENCOUNTER — Ambulatory Visit: Payer: No Typology Code available for payment source | Attending: Cardiology

## 2017-08-20 ENCOUNTER — Ambulatory Visit (INDEPENDENT_AMBULATORY_CARE_PROVIDER_SITE_OTHER): Payer: Self-pay

## 2017-08-20 DIAGNOSIS — Z8241 Family history of sudden cardiac death: Secondary | ICD-10-CM | POA: Insufficient documentation

## 2017-08-20 DIAGNOSIS — I422 Other hypertrophic cardiomyopathy: Secondary | ICD-10-CM

## 2017-08-20 DIAGNOSIS — R000 Tachycardia, unspecified: Secondary | ICD-10-CM | POA: Insufficient documentation

## 2017-08-20 DIAGNOSIS — I472 Ventricular tachycardia, unspecified: Secondary | ICD-10-CM

## 2017-08-20 MED ORDER — GADOBUTROL 1 MMOL/ML IV SOLN
20.0000 mL | Freq: Once | INTRAVENOUS | Status: AC | PRN
Start: 2017-08-20 — End: 2017-08-20
  Administered 2017-08-20: 20 mmol via INTRAVENOUS
  Filled 2017-08-20: qty 20

## 2018-12-22 ENCOUNTER — Telehealth (INDEPENDENT_AMBULATORY_CARE_PROVIDER_SITE_OTHER): Payer: No Typology Code available for payment source | Admitting: Family

## 2018-12-22 ENCOUNTER — Encounter (INDEPENDENT_AMBULATORY_CARE_PROVIDER_SITE_OTHER): Payer: Self-pay | Admitting: Family

## 2018-12-22 ENCOUNTER — Encounter (INDEPENDENT_AMBULATORY_CARE_PROVIDER_SITE_OTHER): Payer: Self-pay

## 2018-12-22 DIAGNOSIS — L03012 Cellulitis of left finger: Secondary | ICD-10-CM

## 2018-12-22 MED ORDER — SULFAMETHOXAZOLE-TRIMETHOPRIM 800-160 MG PO TABS
1.0000 | ORAL_TABLET | Freq: Two times a day (BID) | ORAL | 0 refills | Status: AC
Start: 2018-12-22 — End: 2018-12-29

## 2018-12-22 NOTE — Progress Notes (Signed)
[facility] - AN [place] PARTNER                       Date of Virtual Visit: 12/22/2018 4:44 PM        Patient ID: [name] [name] is a 23 y.o. male.  Attending Physician: Eustaquio Maize, FNP       Telemedicine Eligibility:    State Location:  [x]  [place]  []  [facility] [place] []  [place]  []  Other:    Physical Location:  [x]  Home  []         []        []          []  Other:    Patient Identity Verification:  [x]  State Issued ID  []  Insurance Eligibility Check  []  Other:    Physical Address Verification: (for 911)  [x]  Yes  []  No    Personal identity shared with patient:  [x]  Yes  []  No    Education on nature of video visit shared with patient:  [x]  Yes  []  No    Emergency plan agreed upon with patient:  [x]  Yes  []  No    If the patient had not had this virtual visit, what would they have done?  []         []         []        []          []  Other:    Visit terminated since not appropriate for virtual care:  [x]  N/A  []  Reason:         Chief Complaint:    Chief Complaint   Patient presents with   . Finger infection               HPI:    Patient slammed his left middle finger in a door 2 weeks  - [facility] (Keflex?)  -It was cultured and said that the ABX was appropriate per the culture  -still not healed  There is no drainage today but there has been over the past 2 weeks  There is pain in the finger  No joint pain            Problem List:    There is no problem list on file for this patient.            Current Meds:    No outpatient medications have been marked as taking for the 12/22/18 encounter (Telemedicine Visit) with Eustaquio Maize, FNP.          Allergies:    No Known Allergies          Past Surgical History:    Past Surgical History:   Procedure Laterality Date   . INGUINAL HERNIA REPAIR             Family History:    Family History   Problem Relation Age of Onset   . Cardiomyopathy Mother         Hypertrophic, ICD             Social History:    Social History     Tobacco Use   . Smoking status: Never Smoker   . Smokeless tobacco: Never Used   Substance Use Topics   . Alcohol use: Not on file   . Drug use: Not on file          The  following sections were reviewed this encounter by the provider:            Vital Signs:    There were no vitals taken for this visit.         ROS:    Review of Systems   Constitutional: Negative for activity change.   HENT: Negative for congestion, rhinorrhea and sore throat.    Eyes: Negative for pain and visual disturbance.   Respiratory: Negative for cough, chest tightness and shortness of breath.    Cardiovascular: Negative for chest pain.   Musculoskeletal: Negative for arthralgias and myalgias.   Skin: Positive for rash.   Neurological: Negative for headaches.   Hematological: Does not bruise/bleed easily.              Physical Exam:    Physical Exam  Skin:     Comments: Redness and swelling at and below the cuticle of the left middle finger        GENERAL APPEARANCE: alert, in no acute distress, pleasant, well nourished.   HEAD: normal appearance  EYES: no discharge  EARS: normal hearing  NECK/THYROID: appearance -supple  PSYCH: appropriate affect, appropriate mood, normal speech, normal attention        Assessment / Plan:    1. Paronychia of finger of left hand  - sulfamethoxazole-trimethoprim (Bactrim DS) 800-160 MG per tablet; Take 1 tablet by mouth 2 (two) times daily for 7 days  Dispense: 14 tablet; Refill: 0  -Pt advised to take with food to prevent GI upset  -Take all prescribed medication  -Hand hygiene  -Increase fluids and electrolytes to avoid dehydration  -Tylenol/Ibuprofen for pain and fever   -Call office if symptoms worsen  -Pt may need to come in for I/D if there is no improvement  -Diagnosis and treatment plan discussed  -Pt agrees and expressed understanding  -All questions answered  -F/U as needed      This encounter was a video visit. Patient was positively identified and was verified  to be located in the state of [place] at the time of the visit. Pt consented for bill insurance for this encounter.                        Follow-up:    No follow-ups on file.         Eustaquio Maize, FNP

## 2018-12-26 ENCOUNTER — Encounter (INDEPENDENT_AMBULATORY_CARE_PROVIDER_SITE_OTHER): Payer: Self-pay | Admitting: Family Medicine

## 2019-01-11 ENCOUNTER — Encounter (INDEPENDENT_AMBULATORY_CARE_PROVIDER_SITE_OTHER): Payer: Self-pay | Admitting: Medical

## 2019-01-11 ENCOUNTER — Ambulatory Visit (INDEPENDENT_AMBULATORY_CARE_PROVIDER_SITE_OTHER): Payer: No Typology Code available for payment source | Admitting: Medical

## 2019-01-11 VITALS — BP 114/70 | HR 72 | Temp 98.1°F | Ht 69.0 in | Wt 159.0 lb

## 2019-01-11 DIAGNOSIS — Z0000 Encounter for general adult medical examination without abnormal findings: Secondary | ICD-10-CM

## 2019-01-11 NOTE — Progress Notes (Signed)
[facility] - AN [place] PARTNER                       Date of Exam: 01/11/2019 2:49 PM        Patient ID: [name] [name] is a 24 y.o. male.  Attending Physician: Kandis Mannan, PA        Chief Complaint:    Chief Complaint   Patient presents with    Annual Exam               HPI:    Diet:  Good  Exercise:  Yes       How often?  3-4x/week  Tobacco or nicotine use?  No  Anxiety or depression?  No  Trouble falling asleep or staying asleep?  No    Last dental exam: <6 months ago  Last eye exam:  Unknown  Tdap:  Uknown  Flu:  Would not like one    Additional concerns?  Left flank pain and lower back pain, stress pain in left shoulder area.            Problem List:    There is no problem list on file for this patient.            Current Meds:    Outpatient Medications Marked as Taking for the 01/11/19 encounter (Office Visit) with Kandis Mannan, PA   Medication Sig Dispense Refill    albuterol sulfate HFA (PROVENTIL) 108 (90 Base) MCG/ACT inhaler INL 2 PFS PO Q 4 H PRN            Allergies:    No Known Allergies          Past Surgical History:    Past Surgical History:   Procedure Laterality Date    INGUINAL HERNIA REPAIR  2010           Family History:    Family History   Problem Relation Age of Onset    Cardiomyopathy Mother         Hypertrophic, ICD     Other Mother         Cardiac pacemaker in situ, Hx of sudden cardiac arrest    Heart disease Mother            Social History:    Social History     Tobacco Use    Smoking status: Never Smoker    Smokeless tobacco: Never Used   Substance Use Topics    Alcohol use: Yes     Frequency: Never     Comment: 2-3x a week; 6+ once a week    Drug use: Never          The following sections were reviewed this encounter by the provider: Meds     Problems              Vital Signs:    BP 114/70    Pulse 72    Temp 98.1 F (36.7 C)    Ht 1.753 m (5\' 9" )    Wt 72.1 kg (159 lb)    SpO2 97%    BMI 23.48 kg/m          ROS:    Review of  Systems   Constitutional: Negative for activity change, chills, fatigue, fever and unexpected weight change.   HENT: Negative for congestion, ear pain, postnasal drip, sinus pain and sore throat.    Eyes: Negative for discharge.   Respiratory:  Negative for shortness of breath and wheezing.    Cardiovascular: Negative for chest pain and palpitations.   Gastrointestinal: Negative for abdominal pain and nausea.   Genitourinary: Negative for dysuria, frequency and urgency.   Musculoskeletal: Negative for arthralgias and back pain.   Skin: Negative for rash.   Neurological: Negative for dizziness and weakness.              Physical Exam:    Physical Exam  Vitals signs reviewed.   Constitutional:       Appearance: Normal appearance. He is normal weight.   HENT:      Head: Normocephalic and atraumatic.      Right Ear: Tympanic membrane, ear canal and external ear normal.      Left Ear: Tympanic membrane, ear canal and external ear normal.      Nose: Nose normal.      Mouth/Throat:      Pharynx: Oropharynx is clear. No posterior oropharyngeal erythema.   Eyes:      Pupils: Pupils are equal, round, and reactive to light.   Neck:      Musculoskeletal: Normal range of motion and neck supple.   Cardiovascular:      Rate and Rhythm: Normal rate and regular rhythm.      Pulses: Normal pulses.      Heart sounds: Normal heart sounds.   Pulmonary:      Effort: Pulmonary effort is normal.      Breath sounds: Normal breath sounds. No wheezing.   Chest:      Chest wall: No tenderness.   Abdominal:      General: Abdomen is flat. Bowel sounds are normal. There is no distension.      Palpations: Abdomen is soft.      Tenderness: There is no abdominal tenderness. There is no guarding.   Musculoskeletal: Normal range of motion.   Skin:     General: Skin is warm and dry.   Neurological:      General: No focal deficit present.      Mental Status: He is alert.   Psychiatric:         Mood and Affect: Mood normal.              Assessment:    1.  Wellness examination            Plan:    - the patient is a pleasant 24 yo male who presents for [facility]  - no screening labs are indicated at this time  - exam shows left lower quad pain that has been improving; continue to monitor  - discussed diet and exercise and importance of both  - the patients exam is benign  - up to date on vaccines  - will f/u in 1 year            Follow-up:    No follow-ups on file.         Kandis Mannan, PA

## 2019-04-24 ENCOUNTER — Other Ambulatory Visit (INDEPENDENT_AMBULATORY_CARE_PROVIDER_SITE_OTHER): Payer: Self-pay | Admitting: Family Medicine

## 2019-12-11 IMAGING — CR DG CHEST 2V
2 series · 2 of 2 positions shown · non-contrast
Comparison: None.

CLINICAL DATA: Palpitations, chest pain, and syncope starting 45
minutes ago. Cough for 5 months. Elevated heart rate.

EXAM:
CHEST - 2 VIEW

[chest pa]
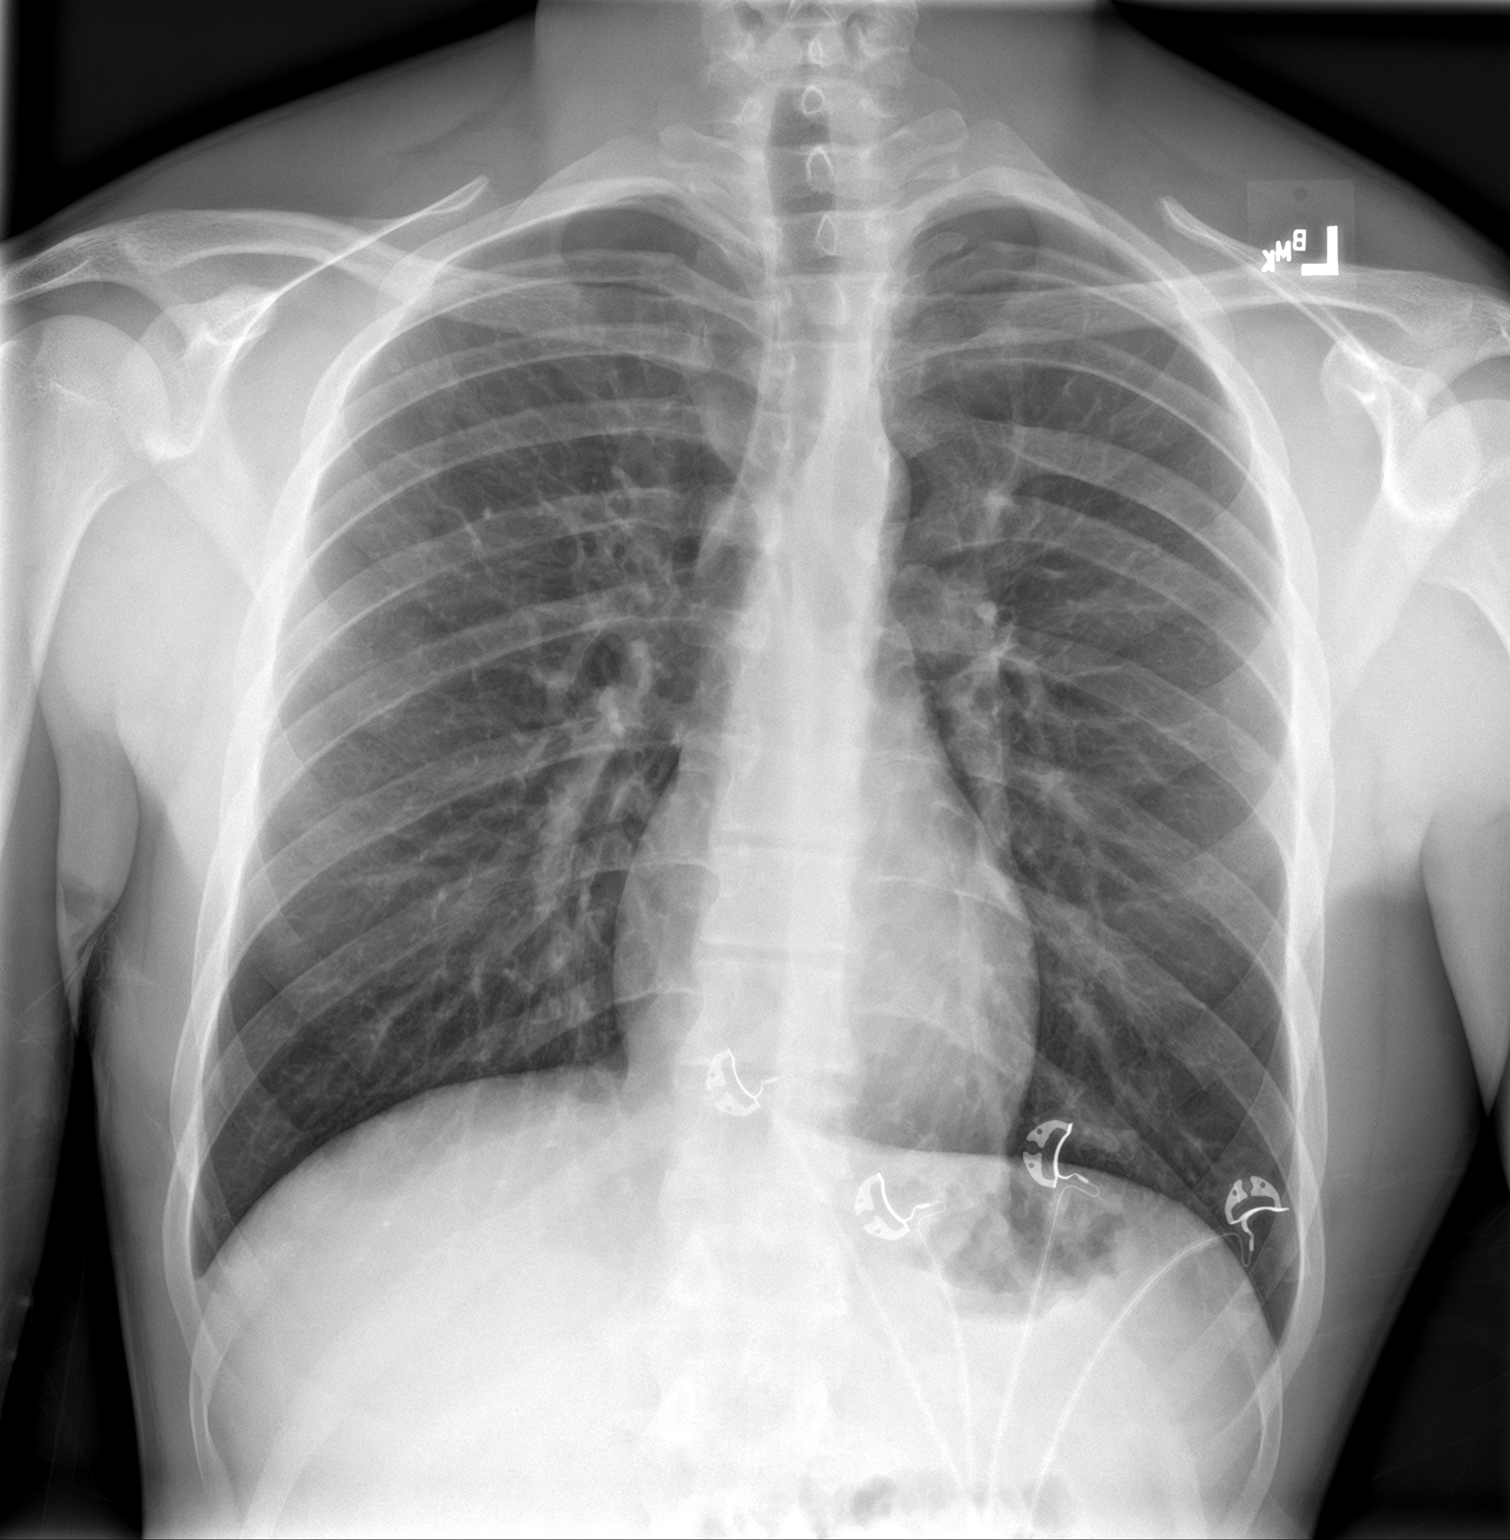

[chest lat]
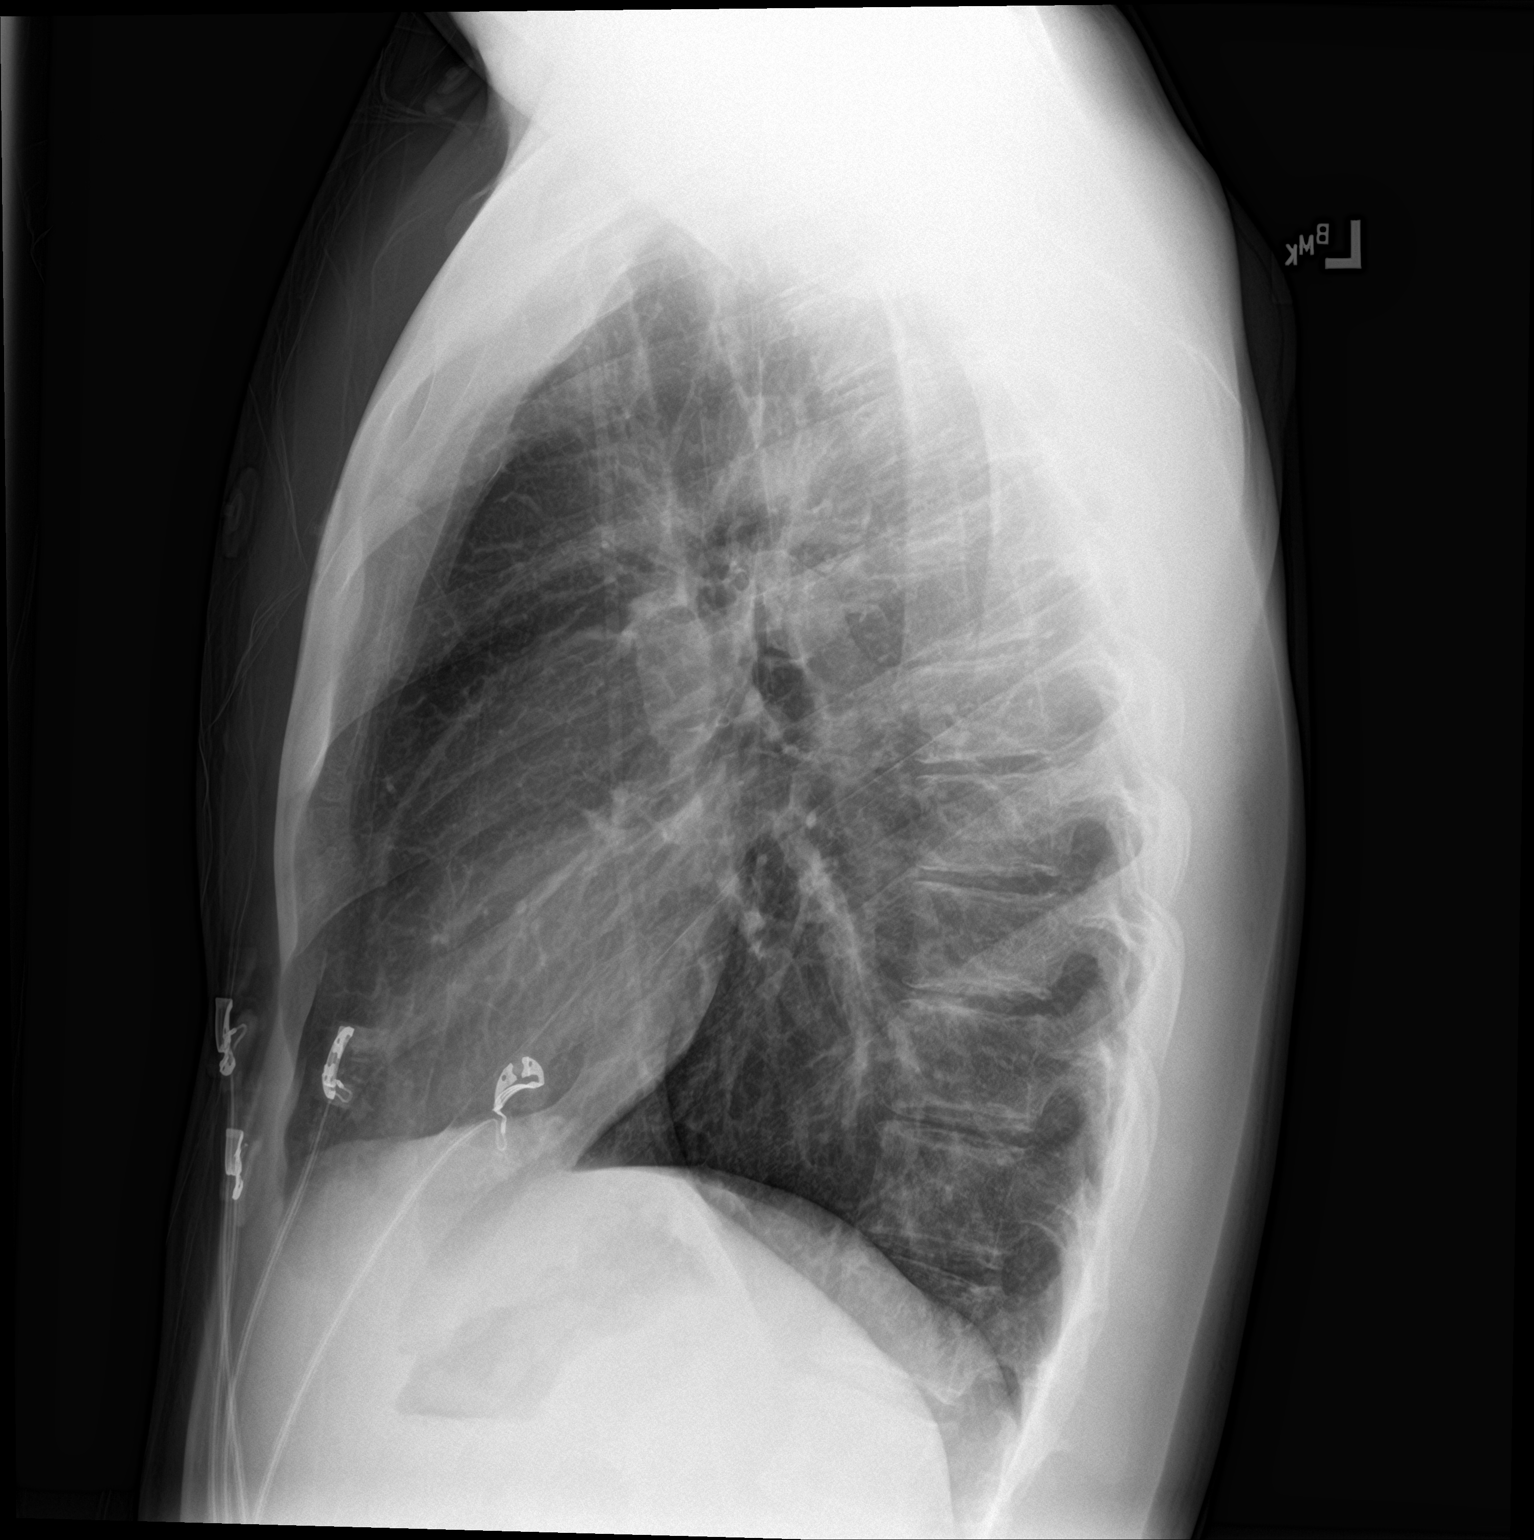

[2 of 2 positions shown; findings below may reference images not displayed]

FINDINGS: The heart size and mediastinal contours are within normal limits.
Both lungs are clear. The visualized skeletal structures are
unremarkable.
IMPRESSION: No active cardiopulmonary disease.

## 2020-04-10 NOTE — H&P (Signed)
[facility] OFFICE  [address] [place], [place] [address]     [name], [name] Bee    Date of Visit:  08/04/2017  Date of Birth: 09-24-1995  Age: 25 yrs.   Medical Record Number: [id number]  Referring Physician: Landis Gandy MD, [name] [name]  __   CURRENT DIAGNOSES     1. Tachycardia, Unspecified, R00.0  2. Palpitations, R00.2  3. Family History Of Sudden Cardiac Death, Z82.41  __   ALLERGIES    No Known Drug Allergies  __  MEDICATIONS      1. No Meds  __  CHIEF COMPLAINT/REASON FOR VISIT  Followup of tachycardia  __   HISTORY OF PRESENT ILLNESS  I had the pleasure of meeting [name] [name] here in the office brought by his mother for a cardiac evaluation because of cardiac evaluations in the setting of his mothers  history of apical hypertrophy of the left ventricle and sudden cardiac death status post resuscitation.     [name] [name] has been a very healthy and active young man. 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 The symptoms persisted and he was taken via rescue to a local hospital. In the ambulance, the paramedics were concerned that he was having a very fast heart rhythm and they were not sure  what it was. Atrial fibrillation was bandied about and SVT was thought about as well but when he got to the hospital, he did not require any treatment with adenosine. In fact, he had sinus rhythm and it was the presumption that his initial rhythm may  have been sinus tachycardia and perhaps he had a panic attack. A cardiologist saw him while he was at the hospital and felt that sinus tachycardia was probably the diagnosis. The ECG, however, was read as left ventricular hypertrophy with nonspecific  ST and T wave changes and sinus rhythm. An echo was performed and read by the cardiologist and it was read as normal.    The patient comes from a background of a very unusual disease. His mother has been a  patient of mine in the past and she gives  me permission to include her history as part of this history and physical. The mother presented with atrial fibrillation relatively early in life and an echo appeared to be normal. In retrospect she had apical hypertrophy of the left ventricle which was  under recognized. She went on to have echocardiography which showed apical hypertrophy but all along she had ST and T wave changes and especially deep T wave inversions very suggestive of the disease of apical hypertrophy of the left ventricle. She went  on to have a sudden cardiac event during exercise and required a defibrillator and she has seen Dr. Elby [name] at [facility] who did genetic testing. There was no genetic abnormality found in his mother but more than likely that is simply because the  gene has simply not been identified to explain her form of hypertrophic cardiomyopathy.     The patient himself, has otherwise been very healthy and asymptomatic denying any chest pain, dyspnea, PND, orthopnea, pedal edema, calf tenderness, prior  episodes of palpitations nor any syncope. Understandably, since his hospitalization he has been very worried. He has bought an Alive Cor device and occasionally he is told he is in atrial fibrillation. He does not feel palpitations day to day, however  and he simply has random  checks of his Alive Cor particularly when he feels his heart rate is a little faster or he is feeling somewhat anxious. During the faster heart rates, atrial fibrillation is sometimes detected. In general, he understandably is  very concerned and wants to know what he can and cant do in the future athletically.   __  PAST HISTORY      Past Medical Illnesses: No previous history of significant medical illnesses.;  Past Cardiac Illnesses : No previous history of cardiac disease.; Infectious Diseases: No previous history of significant infectious diseases.;  Surgical Procedures: No previous surgical procedures.;  Trauma History: No previous history of significant  trauma.; Cardiology Procedures-Invasive: No previous interventional or invasive cardiology procedures.;  Cardiology Procedures-Noninvasive: Echocardiogram July 2019  ___  FAMILY HISTORY   Mother -- Cardiomyopathy (with sudden cardiac death, resuscitated with defibrillator)    __  CARDIAC RISK FACTORS     Tobacco Abuse : has never used tobacco; Family History of Heart Disease: positive; Hyperlipidemia : negative; Hypertension: negative;  Diabetes Mellitus : negative; Prior History of Heart Disease: negative; Obesity : negative; Sedentary Life Style:negative; ZOX:[id number];  Menopausal:not applicable  __  SOCIAL HISTORY     Alcohol Use: beer 2 per week; Smoking: Does not smoke; Never smoker ([id number]);  Diet: Regular diet without modifications and Caffeine use-none; Exercise: Exercises regularly;    __  REVIEW OF SYSTEMS    General: Denies  recent weight loss, weight gain, fever or chills or change in exercise tolerance.; Integumentary: Denies any change in hair or nails, rashes, or skin  lesions.; Eyes: Denies diplopia, glaucoma or visual field defects.; Ears, Nose, Throat, Mouth : Denies any hearing loss, epistaxis, hoarseness or difficulty speaking.;Respiratory: cough, dyspnea with exertion;  Cardiovascular: Please review HPI; Abdominal : Denies ulcer disease, hematochezia or melena.; Musculoskeletal:Denies any venous insufficiency, arthritic symptoms or back problems.; Neurological  : Denies any recurrent strokes, TIA, or seizure disorder.; Psychiatric: Denies any depression, substance abuse or change in cognitive functions.;  Endocrine: Denies any weight change, heat/cold intolerance, polydipsia, or polyuria; Hematologic/Immunologic : Denies any food allergies, seasonal allergies, bleeding disorders.  __  PHYSICAL EXAMINATION    Vital Signs:   Blood Pressure:  138/94 Sitting, Right arm, regular cuff  102/74 BP recheck with large cuff    Weight:  148.80  lbs.  Height: 69"  BMI: 21.97    Pulse: 71/min.       Constitutional: Well developed, well nourished, in no acute distress  Skin: warm and dry to touch Head: normocephalic  Eyes: conjunctivae and lids normal ENT: No pallor or cyanosis  Neck: no JVD, no bruits Chest: clear to auscultation bilaterally  Cardiac: Regular rhythm, no murmurs, gallops or rubs detected Abdomen: abdomen soft  Peripheral Pulses: pulses full and equal in all extremities Extremities/Back: no edema present  Neurological: affect appropriate   __    Medications added today by the physician:     ECG: Sinus rhythm with high left ventricular voltage but otherwise unremarkable for his age. The axis appears rightward again probably due to his age. There is no pattern consistent with any hypertrophic cardiomyopathy in the ST and T wave portion of  the ECG.    IMPRESSION:  1. Palpitations of uncertain etiology. Statistically in the general  population SVT would be the   most likely diagnosis. I dont think he has an anxiety disorder and nothing unusual was   happening at the time of his palpitations. Although he was broadcasting a baseball game,   there  was nothing unusual going  on the in the game when he felt his symptoms suddenly.   He was not exercising and he was not in the middle of some anxiety producing situation.   Certainly, paroxysmal supraventricular tachycardia is common in the general population.   However,  given the patients family history in his mother of sudden cardiac death and   hypertrophic cardiomyopathy, we cannot rule out the possibility of a more serious arrhythmia   including ventricular tachycardia. For that reason, we need a test which  is much more   sensitive than echocardiography in this patient and gadolinium enhanced MRI/MRA is   absolutely indicated in this patient in order to find out whether he has hypertrophic   cardiomyopathy. If he has a normal MRA with no enhancement  of gadolinium, I think we   can move on and I  think at least we should do a 24 hour Holter Monitor because of his   findings on the Alive Cor monitor. We could consider future event monitoring or even a   30 day event monitor, particularly  if the MRA is abnormal, however. I told him that a normal   MRA would allow me to reassure him, although unfortunately not with 100% guarantee.  2. Family history of sudden cardiac death due to apical hypertrophy of the left ventricle -    hypertrophic cardiomyopathy. His mothers ECG all along has been suggestive of apical   hypertrophy and this patients ECG simply has high left ventricular voltage which could   just simply be due to his age and his thin chest wall, however.   3. Anxiety provoked by the underlying situation, understandably. I am not sure if the Alive   Cor tracings are reliable, but I think that further monitoring makes sense at least with a 24   hour Holter monitoring, obviously.     RECOMMENDATION:   1. Cardiac MRA - late gadolinium enhancement would suggest that hypertrophic cardiomyopathy   exists and would then produce much more testing and electrophysiologist consultation  in this   patient. A normal cardiac MRI will allow me to reassure him.  2. 24 hour Holter monitoring with consideration of cardiac event monitoring if he continues to   have symptoms and the Alive Cor is abnormal. If he is truly having occasional  atrial fibrillation,   the prognosis is still good for him in that atrial fibrillation with low CHADS VASQ score does   not suggest an increased risk of stroke. However, it would be a nuisance to have recurrent   palpitations, either SVT or  atrial fib and we can certainly manage those conservatively, or   perhaps start a medicine, either rate control agent or antiarrhythmic if they become bothersome   in his life.   3. I would like to talk to him on the phone about the above  findings and if he is still local at the end   of the testing we will find a way to have him back to the office for  a discussion of the findings   and final recommendations for long term management. No current primary physician.     Harvel Quale, M.D. [facility]    PRN:lo    cc: Myriam Jacobson MD    ____________________________  Christianne Dolin  ZO:[id number] Education ICD-10: R00.2 MedlinePlus Connect results for ICD-10 R00.2  12 Lead ECG Today  Holter Monitor 1 week  MRI Chest w/ Contrast 1 week

## 2021-09-29 ENCOUNTER — Other Ambulatory Visit (INDEPENDENT_AMBULATORY_CARE_PROVIDER_SITE_OTHER): Payer: Self-pay | Admitting: Family

## 2022-05-15 ENCOUNTER — Ambulatory Visit (INDEPENDENT_AMBULATORY_CARE_PROVIDER_SITE_OTHER): Payer: 59 | Admitting: Nurse Practitioner

## 2022-05-15 ENCOUNTER — Encounter (INDEPENDENT_AMBULATORY_CARE_PROVIDER_SITE_OTHER): Payer: Self-pay | Admitting: Nurse Practitioner

## 2022-05-15 VITALS — BP 112/80 | HR 66 | Temp 98.1°F | Ht 69.0 in | Wt 163.0 lb

## 2022-05-15 DIAGNOSIS — R1032 Left lower quadrant pain: Secondary | ICD-10-CM

## 2022-05-15 DIAGNOSIS — Z0000 Encounter for general adult medical examination without abnormal findings: Secondary | ICD-10-CM

## 2022-05-15 DIAGNOSIS — Z23 Encounter for immunization: Secondary | ICD-10-CM

## 2022-05-15 NOTE — Progress Notes (Signed)
[facility] FAMILY PRACTICE - AN [place] PARTNER                       Date of Exam: 05/15/2022 10:17 PM        Patient ID: [name] [name] is a 27 y.o. male.  Attending Physician: Patrina Levering, FNP        Chief Complaint:    Left groin and low back pain              HPI:    Reports left  lower quadrant and groin pain for the past 2 weeks  Reports  dull ache which radiates to left lower back  Pain happens randomly and has not noticed a pattern to it  Admits to doing weight lifting lately  Denies constipation  Reports a history of Hernia in childhood which was repaired  Pain is 3/10  Denies  any fever, chills, diarrhea,  Patient is anxious about cancer. Request for testicular exam              Problem List:    There is no problem list on file for this patient.            Current Meds:    No outpatient medications have been marked as taking for the 05/15/22 encounter (Office Visit) with Patrina Levering, FNP.          Allergies:    No Known Allergies          Past Surgical History:    Past Surgical History:   Procedure Laterality Date    INGUINAL HERNIA REPAIR  2010           Family History:    Family History   Problem Relation Age of Onset    Cardiomyopathy Mother         Hypertrophic, ICD     Other Mother         Cardiac pacemaker in situ, Hx of sudden cardiac arrest    Heart disease Mother            Social History:    Social History     Tobacco Use    Smoking status: Never    Smokeless tobacco: Never   Vaping Use    Vaping status: Never Used   Substance Use Topics    Alcohol use: Yes     Comment: 2-3x a week; 1-2 week    Drug use: Never           The following sections were reviewed this encounter by the provider:   Tobacco  Allergies  Meds  Problems  Med Hx  Surg Hx  Fam Hx             Vital Signs:    BP 112/80 (BP Site: Left arm, Patient Position: Sitting, Cuff Size: Medium)   Pulse 66   Temp 98.1 F (36.7 C) (Temporal)   Ht 1.753 m (5\' 9" )   Wt 73.9 kg (163 lb)   SpO2 99%    BMI 24.07 kg/m          ROS:    Review of Systems   Constitutional:  Negative for chills, fever and unexpected weight change.   Respiratory:  Negative for cough, chest tightness, shortness of breath and wheezing.    Gastrointestinal:  Negative for abdominal pain, anal bleeding, blood in stool, constipation, diarrhea, nausea, rectal pain and vomiting.        Left groin pain  Genitourinary:  Negative for decreased urine volume, penile pain, testicular pain and urgency.   Musculoskeletal:  Positive for back pain.   Hematological:  Negative for adenopathy.   Psychiatric/Behavioral:  The patient is not nervous/anxious.               Physical Exam:    Physical Exam  Constitutional:       General: He is not in acute distress.     Appearance: Normal appearance. He is not ill-appearing.   Cardiovascular:      Rate and Rhythm: Normal rate and regular rhythm.   Pulmonary:      Effort: Pulmonary effort is normal.      Breath sounds: Normal breath sounds. No wheezing or rhonchi.   Abdominal:      General: Bowel sounds are normal.      Tenderness: There is no right CVA tenderness or left CVA tenderness.          Comments: Left groin- no tenderness  No bulging noticed  with cough   Musculoskeletal:      Lumbar back: No tenderness. Negative right straight leg raise test and negative left straight leg raise test.        Back:       Comments: No tenderness on palpation  No erythema   Neurological:      Mental Status: He is alert.              Assessment/Plan:    1. Groin pain, left  - CBC and differential  - Comprehensive metabolic panel  -We will obtain abdominal and pelvic ultrasound  - US Abdomen Complete; Future  - US Abdomen Complete  - US Pelvis Complete; Future  - US Pelvis Complete                  Follow-up:    As needed        Patrina Levering, FNP

## 2022-05-15 NOTE — Progress Notes (Deleted)
[facility] FAMILY PRACTICE - AN [place] PARTNER                       Date of Exam: 05/15/2022 9:09 AM        Patient ID: [name] [name] is a 27 y.o. male.  Attending Physician: Patrina Levering, FNP        Chief Complaint:    No chief complaint on file.              HPI:    Here today for an annual physical exam. Overall feeling well at this time. Please see medical history/concerns listed below.      GENERAL HEALTH MAINTENANCE:  - Dietary habits: Tries to eat balanced diet   - Exercise routine: Cardio and strength, 5-7 week  - Dental care:  Has established dentist, last seen 1 month ago   - Sleep quality: 7-8 hr, no snoring, no apnea   - PHQ-2 score: 0     VACCINE HISTORY:  - Tdap (every 10 years): 2010, due but declines   - Seasonal flu vaccine:  - COVID-19 vaccine:  - HPV vaccine:   due and wants to discuss       SCREENING EXAMS:   CARE GAPS    - STD screening:  no concerns   - Hep C screening:     Cancer screening:   - Family history of cancer: no family history of colon or prostate cancer    After age 76:  - Skin cancer screening: has seen derm, last seen 6 years ago       MEDICAL PROBLEMS/COMPLAINTS:               Problem List:    There is no problem list on file for this patient.            Current Meds:    No outpatient medications have been marked as taking for the 05/15/22 encounter (Appointment) with Patrina Levering, FNP.          Allergies:    Not on File          Past Surgical History:    Past Surgical History:   Procedure Laterality Date    INGUINAL HERNIA REPAIR  2010           Family History:    Family History   Problem Relation Age of Onset    Cardiomyopathy Mother         Hypertrophic, ICD     Other Mother         Cardiac pacemaker in situ, Hx of sudden cardiac arrest    Heart disease Mother            Social History:    Social History     Tobacco Use    Smoking status: Never    Smokeless tobacco: Never   Vaping Use    Vaping status: Never Used   Substance Use Topics     Alcohol use: Yes     Comment: 2-3x a week; 6+ once a week    Drug use: Never           The following sections were reviewed this encounter by the provider:            Vital Signs:    There were no vitals taken for this visit.         ROS:    Review of Systems  Physical Exam:    Physical Exam         Assessment/Plan:    1. Annual physical exam    2. Need for Tdap vaccination                  Follow-up:    No follow-ups on file.         Patrina Levering, FNP

## 2022-05-16 LAB — CBC AND DIFFERENTIAL
Baso(Absolute): 0.1 10*3/uL (ref 0.0–0.2)
Basophils Automated: 1 %
Eosinophils Absolute: 0.1 10*3/uL (ref 0.0–0.4)
Eosinophils Automated: 2 %
Hematocrit: 49.5 % (ref 37.5–51.0)
Hemoglobin: 17.1 g/dL (ref 13.0–17.7)
Immature Granulocytes Absolute: 0 10*3/uL (ref 0.0–0.1)
Immature Granulocytes: 0 %
Lymphocytes Absolute: 2.2 10*3/uL (ref 0.7–3.1)
Lymphocytes Automated: 33 %
MCH: 31.8 pg (ref 26.6–33.0)
MCHC: 34.5 g/dL (ref 31.5–35.7)
MCV: 92 fL (ref 79–97)
Monocytes Absolute: 0.6 10*3/uL (ref 0.1–0.9)
Monocytes: 8 %
Neutrophils Absolute Count: 3.8 10*3/uL (ref 1.4–7.0)
Neutrophils: 56 %
Platelets: 283 10*3/uL (ref 150–450)
RBC: 5.37 x10E6/uL (ref 4.14–5.80)
RDW: 12.2 % (ref 11.6–15.4)
WBC: 6.7 10*3/uL (ref 3.4–10.8)

## 2022-05-16 LAB — COMPREHENSIVE METABOLIC PANEL
ALT: 30 IU/L (ref 0–44)
AST (SGOT): 29 IU/L (ref 0–40)
Albumin/Globulin Ratio: 2.3 — ABNORMAL HIGH (ref 1.2–2.2)
Albumin: 5.1 g/dL (ref 4.3–5.2)
Alkaline Phosphatase: 87 IU/L (ref 44–121)
BUN / Creatinine Ratio: 13 (ref 9–20)
BUN: 15 mg/dL (ref 6–20)
Bilirubin, Total: 0.5 mg/dL (ref 0.0–1.2)
CO2: 25 mmol/L (ref 20–29)
Calcium: 10.2 mg/dL (ref 8.7–10.2)
Chloride: 103 mmol/L (ref 96–106)
Creatinine: 1.17 mg/dL (ref 0.76–1.27)
Globulin, Total: 2.2 g/dL (ref 1.5–4.5)
Glucose: 95 mg/dL (ref 70–99)
Potassium: 5.3 mmol/L — ABNORMAL HIGH (ref 3.5–5.2)
Protein, Total: 7.3 g/dL (ref 6.0–8.5)
Sodium: 142 mmol/L (ref 134–144)
eGFR: 89 mL/min/{1.73_m2} (ref >59)

## 2022-11-04 ENCOUNTER — Other Ambulatory Visit (INDEPENDENT_AMBULATORY_CARE_PROVIDER_SITE_OTHER): Payer: Self-pay | Admitting: Family

## 2023-06-01 ENCOUNTER — Institutional Professional Consult (permissible substitution) (INDEPENDENT_AMBULATORY_CARE_PROVIDER_SITE_OTHER): Admitting: Cardiovascular Disease

## 2023-06-24 ENCOUNTER — Encounter (INDEPENDENT_AMBULATORY_CARE_PROVIDER_SITE_OTHER): Payer: Self-pay | Admitting: Cardiology

## 2023-06-25 ENCOUNTER — Encounter (INDEPENDENT_AMBULATORY_CARE_PROVIDER_SITE_OTHER): Payer: Self-pay | Admitting: Cardiology

## 2023-06-25 ENCOUNTER — Other Ambulatory Visit (INDEPENDENT_AMBULATORY_CARE_PROVIDER_SITE_OTHER)

## 2023-06-25 ENCOUNTER — Ambulatory Visit (INDEPENDENT_AMBULATORY_CARE_PROVIDER_SITE_OTHER): Admitting: Cardiology

## 2023-06-25 VITALS — BP 108/78 | HR 63 | Ht 69.0 in | Wt 162.0 lb

## 2023-06-25 DIAGNOSIS — Z7689 Persons encountering health services in other specified circumstances: Secondary | ICD-10-CM

## 2023-06-25 DIAGNOSIS — R002 Palpitations: Secondary | ICD-10-CM

## 2023-06-25 DIAGNOSIS — Z8249 Family history of ischemic heart disease and other diseases of the circulatory system: Secondary | ICD-10-CM

## 2023-06-25 DIAGNOSIS — R012 Other cardiac sounds: Secondary | ICD-10-CM

## 2023-06-25 LAB — ECG 12-LEAD
Atrial Rate: 63 {beats}/min
IHS MUSE NARRATIVE AND IMPRESSION: NORMAL
P Axis: 71 degrees
P-R Interval: 164 ms
Q-T Interval: 402 ms
QRS Duration: 94 ms
QTC Calculation (Bezet): 411 ms
R Axis: 89 degrees
T Axis: 72 degrees
Ventricular Rate: 63 {beats}/min

## 2023-06-25 NOTE — Progress Notes (Signed)
 [place]  HEART CARDIOLOGY OFFICE CONSULTATION NOTE    [facility]  [name] [name]  [facility] OFFICE -CARDIOLOGY  [name]  [place] [address]  Dept: [phone]  Dept Fax: [phone]         Patient Name: [name] [name],[name] [name]    Date of Visit:  June 25, 2023  Date of Birth: [birth year]  AGE: 28 y.o.  Medical Record #: [id number]  Requesting Physician: Neita Cramp, FNP      CHIEF COMPLAINT:  Establish Care      HISTORY OF PRESENT ILLNESS    [name] is being seen today for cardiovascular evaluation at the request of Neita Cramp, FNP. He is a pleasant 28 y.o. male who has a history of hypertrophic cardiomyopathy in his mother, whom I have seen through the years as a patient.  I saw [name] [name] in 2019 and he had an unremarkable echo and an unremarkable cardiac MRI when he presented with rapid palpitations with room darkening felt by the pediatric and adult congenital electrophysiologist Dr. 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  He had extensive monitoring which did not show arrhythmias.  Since it is now 6 years later and he has begun to have some palpitations when he exercises, he now comes in for a visit, which was recommended regardless because of the family history of HCM.  Thankfully the PSVT type palpitations have disappeared.    [name] [name] is very athletic and when he runs or is at the gym working out he has experienced about 50% of the time palpitations during exercise.  He feels that his heart is beating irregularly but then as soon as he stops the symptoms go away.  The symptoms have occurred only for few seconds at a time but are bothersome to him.    He denies any resting symptoms whatsoever.  Specifically no dyspnea, PND, orthopnea, pedal edema, syncope, palpitations nor does he snore or have trouble sleeping.    We discussed that his mother also has a history of paroxysmal atrial fibrillation and he asked whether that is familial and I told him that occasionally it is.    His mother had  genetic testing for hypertrophic cardiomyopathy long ago but she is about to see a hypertrophic cardiomyopathy specialist.  His mother is in the room during the visit and she will likely have repeat genetic testing as part of her evaluation by the HCM specialist.  If gene is found I explained to the patient that he would benefit from gene testing himself, but if no gene is found in his mother for hypertrophic cardiomyopathy we would continue to do screening echocardiography every 5 years or so per current guidelines.  We had a long discussion about penetrance of genes and risk of sudden cardiac death with hypertrophic cardiomyopathy.  Unfortunately, his mother has had ventricular fibrillation arrest for which she was resuscitated which would increase his risk if he happens to have hypertrophic cardiomyopathy.  His 2019 workup was unremarkable, he reminds me.      PAST MEDICAL HISTORY: He has a past medical history of Dizziness, H/O echocardiogram (07/09/2017), and Palpitations. He has a past surgical history that includes Inguinal hernia repair (2010).    Allergies  Reviewed by Eber Art, MD on 06/25/2023   No Known Allergies          MEDICATIONS:   Patient's current medications were reviewed. ONLY Cardiac medications were updated unless others were addressed in assessment and plan.    No current outpatient medications  relevant to Cardiology on file.     Current Outpatient Medications (Other)   Medication Sig    tretinoin Apply topically       FAMILY HISTORY: family history includes Cardiomyopathy in his mother; Heart disease in his mother; Other in his mother.    SOCIAL HISTORY: He reports that he has never smoked. He has never used smokeless tobacco. He reports current alcohol use. He reports that he does not use drugs.    PHYSICAL EXAMINATION    Visit Vitals  BP 108/78 (BP Site: Left arm, Patient Position: Sitting, Cuff Size: Small)   Pulse 63   Ht 1.753 m (5' 9)   Wt 73.5 kg (162 lb)   BMI 23.92 kg/m         Constitutional: Cooperative, alert, no acute distress.  Neck: No carotid bruits, JVP normal.  Cardiac: Regular rate and rhythm, normal S1 and S2; no S3 or S4. No murmurs. No rubs, no gallops.  Midsystolic click present.  No murmur with Valsalva.  Pulmonary: Clear to auscultation bilaterally, no wheezing, no rhonchi, no rales.  Extremities: no edema.  Vascular: +2 pulses in radial artery bilaterally, 2+ pedal pulses bilaterally.      ECG: Normal sinus rhythm.  Normal EKG.      LABS REVIEWED:   Lab Results   Component Value Date    WBC 6.7 05/15/2022    HGB 17.1 05/15/2022    HCT 49.5 05/15/2022    PLT 283 05/15/2022     Lab Results   Component Value Date    GLU 95 05/15/2022    BUN 15 05/15/2022    CREAT 1.17 05/15/2022    NA 142 05/15/2022    K 5.3 (H) 05/15/2022    CL 103 05/15/2022    CO2 25 05/15/2022    AST 29 05/15/2022    ALT 30 05/15/2022     No results found for: MG, TSH, HGBA1C, BNP  No results found for: CHOL, TRIG, HDL, LDL  No results found for: LPACHOL          IMPRESSION:     Palpitations with exercise which appear likely to be benign PVCs although with a family history of hypertrophic cardiomyopathy ventricular tachycardia should be searched for both with exercise and with a continuous ambulatory monitor.  He would prefer a relatively short monitor.  Only 3 days.  That is reasonable.  Certainly the palpitations could represent atrial fibrillation which also runs in his family but that is less likely per his description.  Benign APCs are also possible certainly.    2.  Family history of hypertrophic cardiomyopathy in his mother, who also has a history of resuscitation from V-fib arrest.  His mother had genetic testing long ago which was negative but I encouraged her to repeat the genetic testing and have referred her to our hypertrophic cardiomyopathy specialist as well.  She is also going to get an alpha galactosidase level performed.    3.  Athleticism.  No exertional symptoms  except for palpitations, however.    4.  Family history of paroxysmal atrial fibrillation in his mother.    5.  Abnormal heart sounds.  Midsystolic click on physical exam but no murmur with Valsalva or without.    6.  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  Recommendations:    1.  3-day continuous ambulatory monitor.  That is the longest period of time he would like to have monitoring because of his athleticism especially.  He will continue athleticism but avoid swimming during the monitoring period.    2.  Echocardiography both because of midsystolic click and also because of family history of hypertrophic cardiomyopathy.  Last echo was more than 5 years ago.    3.  Stress test because we would like to reproduce the palpitation symptoms.    4.  After his mother has gene testing, if genetic cause for hypertrophic cardiomyopathy is found, he should then be gene tested himself.  He will be vindicated if he has a negative gene test.  Otherwise, he will need repeat echocardiography in about 5 years because of the autosomal dominant nature of hypertrophic cardiomyopathy.                                                 Orders Placed This Encounter   Procedures    ECG 12 lead (Normal)       Primary care physician notes were reviewed.  Pediatri and adult congenital electrophysiologist notes were reviewed.        SIGNED:    Laurance Chain, MD         This note was generated by the Dragon speech recognition and may contain errors or omissions not intended by the user. Grammatical errors, random word insertions, deletions, pronoun errors, and incomplete sentences are occasional consequences of this technology due to software limitations. Not all errors are caught or corrected. If there are questions or concerns  about the content of this note or information contained within the body of this dictation, they should be addressed directly with the author for clarification.

## 2023-07-01 ENCOUNTER — Ambulatory Visit (INDEPENDENT_AMBULATORY_CARE_PROVIDER_SITE_OTHER)

## 2023-07-01 DIAGNOSIS — R012 Other cardiac sounds: Secondary | ICD-10-CM

## 2023-07-01 DIAGNOSIS — Z8249 Family history of ischemic heart disease and other diseases of the circulatory system: Secondary | ICD-10-CM

## 2023-07-01 DIAGNOSIS — R002 Palpitations: Secondary | ICD-10-CM

## 2023-07-01 LAB — CARDIAC STRESS TEST

## 2023-07-02 ENCOUNTER — Telehealth (INDEPENDENT_AMBULATORY_CARE_PROVIDER_SITE_OTHER): Payer: Self-pay

## 2023-07-02 ENCOUNTER — Telehealth (INDEPENDENT_AMBULATORY_CARE_PROVIDER_SITE_OTHER): Payer: Self-pay | Admitting: Cardiology

## 2023-07-02 DIAGNOSIS — R9439 Abnormal result of other cardiovascular function study: Secondary | ICD-10-CM

## 2023-07-02 NOTE — Telephone Encounter (Signed)
 [name] [name] called this morning with a follow-up question for Dr Eber regarding his stress test results after their phone conversation this morning.    [name] [name] explains that he thought his stress test was normal but when he spoke to Dr Eber this morning he thought he heard him say the stress test was abnormal?    In review of the stress test report, I confirmed that due to ischemic changes on his ECG during the test, the stress test would be considered positive or abnormal, which is why Dr Eber has asked him to have an echocardiogram to r/o HCM given these results, age, exercise capacity and family history.    I assured him that I would reach out to Dr Eber regarding this question and will give him a call as soon as I hear back.

## 2023-07-02 NOTE — Telephone Encounter (Signed)
 I was able to reach [place] and spoke with him, at his request.  I told him it is vital that we do the echo as planned but I think a stress echo after that if the echo is normal would also be important just in case he happens to have a coronary lesion, although uncommon at age 28.  I put an order in for the stress echo after the regular echo presuming the regular echo does not show hypertrophic cardiomyopathy.

## 2023-07-06 ENCOUNTER — Encounter (INDEPENDENT_AMBULATORY_CARE_PROVIDER_SITE_OTHER): Payer: Self-pay | Admitting: Cardiovascular Disease

## 2023-07-06 DIAGNOSIS — Z8249 Family history of ischemic heart disease and other diseases of the circulatory system: Secondary | ICD-10-CM

## 2023-07-06 DIAGNOSIS — R002 Palpitations: Secondary | ICD-10-CM

## 2023-07-06 LAB — HOLTER MONITOR
# of A-fib episodes: 0
# of PACs: 23
# of PVC beats in couplets: 2
# of PVCs: 11
# of SVT episodes: 0
# of VT episodes: 0
# of pauses: 0
% Noise burden: 6.16 %
% PACs (burden): 0.01 %
% PVCs (burden): 0 %
Average HR: 73 {beats}/min
Max HR: 193 {beats}/min
Min HR: 45 {beats}/min
Time in A-fib (burden): 0 %

## 2023-07-16 ENCOUNTER — Ambulatory Visit
Admission: RE | Admit: 2023-07-16 | Discharge: 2023-07-16 | Disposition: A | Discharge status: Home / Self Care | Source: Ambulatory Visit | Attending: Cardiology | Admitting: Cardiology

## 2023-07-16 DIAGNOSIS — R002 Palpitations: Secondary | ICD-10-CM | POA: Insufficient documentation

## 2023-07-16 DIAGNOSIS — Z8249 Family history of ischemic heart disease and other diseases of the circulatory system: Secondary | ICD-10-CM | POA: Insufficient documentation

## 2023-07-16 DIAGNOSIS — R012 Other cardiac sounds: Secondary | ICD-10-CM | POA: Insufficient documentation

## 2023-07-19 ENCOUNTER — Encounter (INDEPENDENT_AMBULATORY_CARE_PROVIDER_SITE_OTHER): Payer: Self-pay

## 2023-07-19 LAB — ECHO ADULT TTE COMPLETE
AV Area (Cont Eq VTI): 3.3861
AV Mean Gradient: 2
AV Peak Velocity: 0.979
Ao Root Diameter (2D): 3.1
BP Mod LV Ejection Fraction: 66
IVS Diastolic Thickness (2D): 0.9
LA Dimension (2D): 3.3
LA Volume Index (BP A-L): 18.3718
LVID diastole (2D): 5.1
LVID systole (2D): 2.9
MV E/A: 1.5
MV E/e' (Average): 3.7912
Mitral Valve Findings: NORMAL
Prox Ascending Aorta Diameter: 2.6
Pulmonary Valve Findings: NORMAL
RV Basal Diastolic Dimension: 3.9
RV Systolic Pressure: 20.64
TAPSE: 2
Tricuspid Valve Findings: NORMAL

## 2023-09-16 ENCOUNTER — Encounter (INDEPENDENT_AMBULATORY_CARE_PROVIDER_SITE_OTHER)

## 2023-09-17 NOTE — Progress Notes (Deleted)
 [facility] AT [facility] - A [facility] MEMBER OF [facility]                       Date of Exam: 09/20/2023 9:55 AM        Patient ID: [name] [name] is a 28 y.o. male.  Attending Physician: Gilmore GORMAN Marten, PA        Chief Complaint:    No chief complaint on file.       {  Disappearing Text  Click a link below to be taken to that activity or part of the chart   Chart Review  Order Review  Review Flowsheets  Labs  Health Maintenance  Immunizations  Allergies  Medications  Problem List  History  Synopsis   :[id number]}         HPI:    HPI          Problem List:    Problem List[1]          Current Meds:    Medications Taking[2]       Allergies:    Allergies[3]          Past Surgical History:    Past Surgical History[4]        Family History:    Family History[5]        Social History:    Social History[6]        The following sections were reviewed this encounter by the provider:            Vital Signs:    There were no vitals taken for this visit.         ROS:    Review of Systems           Physical Exam:    Physical Exam         Assessment:    There are no diagnoses linked to this encounter.          Plan:                Follow-up:    No follow-ups on file.         Gilmore GORMAN Marten, PA                   [1]   Patient Active Problem List  Diagnosis    Palpitations   [2]   No outpatient medications have been marked as taking for the 09/20/23 encounter (Appointment) with Marten Gilmore GORMAN, PA.   [3] No Known Allergies  [4]   Past Surgical History:  Procedure Laterality Date    INGUINAL HERNIA REPAIR  2010   [5]   Family History  Problem Relation Name Age of Onset    Cardiomyopathy Mother          Hypertrophic, ICD     Other Mother          Cardiac pacemaker in situ, Hx of sudden cardiac arrest    Heart disease Mother     [6]   Social History  Tobacco Use    Smoking status: Never    Smokeless tobacco: Never   Vaping Use    Vaping status: Never Used   Substance Use Topics    Alcohol use:  Yes     Comment: 2-3x a week; 1-2 week    Drug use: Never

## 2023-09-20 ENCOUNTER — Encounter (INDEPENDENT_AMBULATORY_CARE_PROVIDER_SITE_OTHER): Payer: Self-pay

## 2023-09-20 ENCOUNTER — Encounter (INDEPENDENT_AMBULATORY_CARE_PROVIDER_SITE_OTHER)

## 2023-09-20 ENCOUNTER — Ambulatory Visit (INDEPENDENT_AMBULATORY_CARE_PROVIDER_SITE_OTHER)

## 2023-09-20 VITALS — BP 120/86 | HR 67 | Temp 97.7°F | Wt 170.0 lb

## 2023-09-20 DIAGNOSIS — Z0000 Encounter for general adult medical examination without abnormal findings: Secondary | ICD-10-CM

## 2023-09-20 DIAGNOSIS — Z23 Encounter for immunization: Secondary | ICD-10-CM

## 2023-09-20 NOTE — Progress Notes (Signed)
 [facility] AT [facility] - A [facility]                       Date of Exam: 09/20/2023 8:45 AM        Patient ID: [name] [name] is a 28 y.o. male.  Attending Physician: Gilmore GORMAN Marten, PA        Chief Complaint:    No chief complaint on file.                HPI:    Pt presents today for annual physical:  Fasting today- yes     Diet is healthy, well-balanced with normal portions- yes  Exercising regularly- yes, running and weights 5x/week  Smoking/vaping- no  Alcohol intake- 3-5 drinks/week   Normal sleep patterns- yes  Overall mood- good, stable     IMMUNIZATIONS - see form  Tdap - 2010; booster due   Shingles (over 50)- n/a  Flu- pt declines   COVID vaccine -      PREVENTATIVE SCREENINGS  MOST RECENT:   Dental Exam - 2x/year  Eye Exam - no  Hearing concerns- no  Colonoscopy/FIT (over age 54 and/or fam hx of colon cancer)- n/a  PSA (Family hx of prostate cancer)- no known family hx      Additional Concerns: none               Problem List:    Problem List[1]          Current Meds:    Medications Taking[2]       Allergies:    Allergies[3]          Past Surgical History:    Past Surgical History[4]        Family History:    Family History[5]        Social History:    Social History[6]        The following sections were reviewed this encounter by the provider:   Tobacco  Allergies  Meds  Problems  Med Hx  Surg Hx  Fam Hx             Vital Signs:    BP 120/86 (BP Site: Left arm, Patient Position: Sitting)   Pulse 67   Temp 97.7 F (36.5 C)   Wt 77.1 kg (170 lb)   SpO2 95%   BMI 25.10 kg/m          ROS:    Review of Systems   Constitutional:  Negative for fever.   Respiratory:  Negative for cough and shortness of breath.    Cardiovascular:  Negative for chest pain.   Gastrointestinal:  Negative for constipation, diarrhea, nausea and vomiting.   Genitourinary:  Negative for difficulty urinating.   Skin:  Negative for rash.              Physical Exam:    Physical  Exam  Vitals and nursing note reviewed.   Constitutional:       Appearance: Normal appearance. He is not ill-appearing.   HENT:      Head: Normocephalic and atraumatic.   Eyes:      Extraocular Movements: Extraocular movements intact.   Cardiovascular:      Rate and Rhythm: Normal rate and regular rhythm.   Pulmonary:      Effort: Pulmonary effort is normal.      Breath sounds: Normal breath sounds.   Neurological:      Mental Status:  He is alert and oriented to person, place, and time.   Psychiatric:         Mood and Affect: Mood normal.         Behavior: Behavior normal.         Thought Content: Thought content normal.         Judgment: Judgment normal.              Assessment:    1. Physical exam  - CBC with Differential (Order); Future  - Comprehensive Metabolic Panel; Future  - Lipid Panel; Future  - TSH; Future  - CBC with Differential (Order)  - Comprehensive Metabolic Panel  - Lipid Panel  - TSH    2. Need for Tdap vaccination  - Tdap vaccine greater than or equal to 7yo IM            Plan:      Assessment & Plan  Adult Wellness Visit  Routine wellness evaluation for a 28 year old male.   Family history of HCM noted, recent cardiac tests normal.  Physical Exam completed today without abnormal findings.   Lab work ordered.  Discussed healthy diet and exercise recommendations.   Vaccines reviewed and recommended based on CDC recommendations.   FH, Social Hx reviewed.   All the patients questions answered.                   Follow-up:    Return in about 1 year (around 09/19/2024).         Gilmore GORMAN Marten, PA                     [1]   Patient Active Problem List  Diagnosis    Palpitations   [2]   Outpatient Medications Marked as Taking for the 09/20/23 encounter (Office Visit) with Marten Gilmore GORMAN, PA   Medication Sig Dispense Refill    tretinoin (RETIN-A) 0.025 % cream Apply topically     [3] No Known Allergies  [4]   Past Surgical History:  Procedure Laterality Date    INGUINAL HERNIA REPAIR  2010   [5]    Family History  Problem Relation Name Age of Onset    Cardiomyopathy Mother          Hypertrophic, ICD     Other Mother          Cardiac pacemaker in situ, Hx of sudden cardiac arrest    Heart disease Mother     [6]   Social History  Tobacco Use    Smoking status: Never    Smokeless tobacco: Never   Vaping Use    Vaping status: Never Used   Substance Use Topics    Alcohol use: Yes     Comment: 2-3x a week; 1-2 week    Drug use: Never

## 2023-09-21 ENCOUNTER — Ambulatory Visit (INDEPENDENT_AMBULATORY_CARE_PROVIDER_SITE_OTHER): Payer: Self-pay

## 2023-09-21 LAB — COMPREHENSIVE METABOLIC PANEL
ALT: 26 IU/L (ref 0–44)
AST (SGOT): 24 IU/L (ref 0–40)
Albumin: 4.6 g/dL (ref 4.3–5.2)
Alkaline Phosphatase: 75 IU/L (ref 47–123)
BUN / Creatinine Ratio: 12 (ref 9–20)
BUN: 14 mg/dL (ref 6–20)
Bilirubin, Total: 0.6 mg/dL (ref 0.0–1.2)
CO2: 27 mmol/L (ref 20–29)
Calcium: 9.7 mg/dL (ref 8.7–10.2)
Chloride: 101 mmol/L (ref 96–106)
Creatinine: 1.2 mg/dL (ref 0.76–1.27)
Globulin, Total: 1.9 g/dL (ref 1.5–4.5)
Glucose: 96 mg/dL (ref 70–99)
Potassium: 4.6 mmol/L (ref 3.5–5.2)
Protein, Total: 6.5 g/dL (ref 6.0–8.5)
Sodium: 138 mmol/L (ref 134–144)
eGFR: 86 mL/min/1.73 (ref >59)

## 2023-09-21 LAB — CBC AND DIFFERENTIAL
Baso(Absolute): 0.1 x10E3/uL (ref 0.0–0.2)
Basophils Automated: 1 %
Eosinophils Absolute: 0.3 x10E3/uL (ref 0.0–0.4)
Eosinophils Automated: 5 %
Hematocrit: 48.5 % (ref 37.5–51.0)
Hemoglobin: 16 g/dL (ref 13.0–17.7)
Immature Granulocytes Absolute: 0 x10E3/uL (ref 0.0–0.1)
Immature Granulocytes: 0 %
Lymphocytes Absolute: 2.1 x10E3/uL (ref 0.7–3.1)
Lymphocytes Automated: 40 %
MCH: 30.8 pg (ref 26.6–33.0)
MCHC: 33 g/dL (ref 31.5–35.7)
MCV: 93 fL (ref 79–97)
Monocytes Absolute: 0.7 x10E3/uL (ref 0.1–0.9)
Monocytes: 12 %
Neutrophils Absolute Count: 2.2 x10E3/uL (ref 1.4–7.0)
Neutrophils: 42 %
Platelets: 258 x10E3/uL (ref 150–450)
RBC: 5.19 x10E6/uL (ref 4.14–5.80)
RDW: 11.8 % (ref 11.6–15.4)
WBC: 5.2 x10E3/uL (ref 3.4–10.8)

## 2023-09-21 LAB — LIPID PANEL
Cholesterol / HDL Ratio: 2.8 ratio (ref 0.0–5.0)
Cholesterol: 185 mg/dL (ref 100–199)
HDL: 65 mg/dL (ref >39)
LDL Chol Calculated (NIH): 104 mg/dL — ABNORMAL HIGH (ref 0–99)
Triglycerides: 86 mg/dL (ref 0–149)
VLDL Calculated: 16 mg/dL (ref 5–40)

## 2023-09-21 LAB — TSH: TSH: 1.22 u[IU]/mL (ref 0.450–4.500)

## 2023-11-05 ENCOUNTER — Other Ambulatory Visit (INDEPENDENT_AMBULATORY_CARE_PROVIDER_SITE_OTHER): Payer: Self-pay | Admitting: Family
